# Patient Record
Sex: Female | Born: 1935 | Race: Black or African American | Hispanic: No | Smoking: Former smoker
Health system: Southern US, Community
[De-identification: ages and names within clinical notes are randomized; demographics above are authoritative.]

## PROBLEM LIST (undated history)

## (undated) DIAGNOSIS — I10 Essential (primary) hypertension: Secondary | ICD-10-CM

## (undated) DIAGNOSIS — H409 Unspecified glaucoma: Secondary | ICD-10-CM

## (undated) HISTORY — PX: EYE SURGERY: SHX253

## (undated) HISTORY — PX: TONSILLECTOMY: SUR1361

## (undated) HISTORY — PX: HERNIA REPAIR: SHX51

## (undated) HISTORY — PX: HEMORROIDECTOMY: SUR656

## (undated) HISTORY — PX: COLONOSCOPY: SHX174

## (undated) HISTORY — PX: ABDOMINAL HYSTERECTOMY: SHX81

---

## 2000-03-07 ENCOUNTER — Encounter: Admission: RE | Admit: 2000-03-07 | Discharge: 2000-06-05 | Payer: Self-pay | Admitting: Internal Medicine

## 2003-12-31 ENCOUNTER — Encounter: Admission: RE | Admit: 2003-12-31 | Discharge: 2003-12-31 | Payer: Self-pay | Admitting: Endocrinology

## 2005-01-03 ENCOUNTER — Ambulatory Visit (HOSPITAL_COMMUNITY): Admission: RE | Admit: 2005-01-03 | Discharge: 2005-01-03 | Payer: Self-pay | Admitting: Ophthalmology

## 2005-04-25 ENCOUNTER — Ambulatory Visit (HOSPITAL_COMMUNITY): Admission: RE | Admit: 2005-04-25 | Discharge: 2005-04-25 | Payer: Self-pay | Admitting: Ophthalmology

## 2005-06-16 ENCOUNTER — Ambulatory Visit (HOSPITAL_COMMUNITY): Admission: RE | Admit: 2005-06-16 | Discharge: 2005-06-16 | Payer: Self-pay | Admitting: Ophthalmology

## 2007-06-01 ENCOUNTER — Ambulatory Visit (HOSPITAL_COMMUNITY): Admission: RE | Admit: 2007-06-01 | Discharge: 2007-06-01 | Payer: Self-pay | Admitting: *Deleted

## 2007-06-04 ENCOUNTER — Ambulatory Visit (HOSPITAL_COMMUNITY): Admission: RE | Admit: 2007-06-04 | Discharge: 2007-06-04 | Payer: Self-pay | Admitting: *Deleted

## 2008-09-12 HISTORY — PX: OTHER SURGICAL HISTORY: SHX169

## 2010-03-06 ENCOUNTER — Ambulatory Visit (HOSPITAL_COMMUNITY): Admission: RE | Admit: 2010-03-06 | Discharge: 2010-03-06 | Payer: Self-pay | Admitting: Ophthalmology

## 2010-11-22 ENCOUNTER — Other Ambulatory Visit: Payer: Self-pay | Admitting: Endocrinology

## 2010-11-22 DIAGNOSIS — R634 Abnormal weight loss: Secondary | ICD-10-CM

## 2010-11-24 ENCOUNTER — Ambulatory Visit
Admission: RE | Admit: 2010-11-24 | Discharge: 2010-11-24 | Disposition: A | Discharge status: Home / Self Care | Payer: Medicare Other | Source: Ambulatory Visit | Attending: Endocrinology | Admitting: Endocrinology

## 2010-11-24 DIAGNOSIS — R634 Abnormal weight loss: Secondary | ICD-10-CM

## 2010-11-24 MED ORDER — IOHEXOL 300 MG/ML  SOLN
100.0000 mL | Freq: Once | INTRAMUSCULAR | Status: AC | PRN
  Administered 2010-11-24: 100 mL via INTRAVENOUS

## 2010-11-28 LAB — CBC
HCT: 38.5 % (ref 36.0–46.0)
Hemoglobin: 13 g/dL (ref 12.0–15.0)
MCH: 31.4 pg (ref 26.0–34.0)
MCHC: 33.8 g/dL (ref 30.0–36.0)
MCV: 92.9 fL (ref 78.0–100.0)
Platelets: 249 10*3/uL (ref 150–400)
RBC: 4.14 MIL/uL (ref 3.87–5.11)
RDW: 13.8 % (ref 11.5–15.5)
WBC: 7.3 10*3/uL (ref 4.0–10.5)

## 2010-11-28 LAB — GLUCOSE, CAPILLARY
Glucose-Capillary: 86 mg/dL (ref 70–99)
Glucose-Capillary: 90 mg/dL (ref 70–99)

## 2010-11-28 LAB — BASIC METABOLIC PANEL
CO2: 29 mEq/L (ref 19–32)
GFR calc Af Amer: >60 mL/min (ref >60)
GFR calc non Af Amer: >60 mL/min (ref >60)
Glucose, Bld: 91 mg/dL (ref 70–99)
Sodium: 138 mEq/L (ref 135–145)

## 2010-11-28 LAB — MRSA PCR SCREENING: MRSA by PCR: NEGATIVE

## 2010-12-01 NOTE — Op Note (Signed)
Stacey Nelson, Stacey Nelson                ACCOUNT NO.:  [id number]  MEDICAL RECORD NO.:  [id number]          PATIENT TYPE:  AMB  LOCATION:  SDS                          FACILITY:  [facility]  PHYSICIAN:  Chalmers Guest, M.D.     DATE OF BIRTH:  [birth year]  DATE OF PROCEDURE:  03/06/2010 DATE OF DISCHARGE:  03/06/2010                              OPERATIVE REPORT  PREOPERATIVE DIAGNOSIS:  Uncontrolled open-angle glaucoma, left eye, despite maximum tolerated medical therapy and maximum laser therapy.  POSTOPERATIVE DIAGNOSES:  Uncontrolled open-angle glaucoma, left eye, despite maximum tolerated medical therapy and maximum laser therapy. The patient has preexisting scarring to the eye from previous cataract surgery.  ANESTHESIA:  Topical Xylocaine jelly as well as topical Xylocaine, Marcaine mixture.  In the holding area, the topical anesthetics were applied and the patient was transported to the operating room where her face was prepped and draped in the usual sterile fashion with the surgeon sitting superiorly and operating microscope in position.  A 6-0 nylon suture was passed through clear cornea to infraduct the eye.  Following this, the superior temporal quadrant was chosen because the superior nasal quadrant had conjunctival scarring.  Using Hoskins forceps and a Westcott scissor, an incision was made at the limbus and was bluntly dissected posteriorly forming a fornix-based conjunctival flap.  The conjunctiva was recessed using a Weck-cel sponge.  A Tooke blade was used to recess Tenon fibers.  There was slightly excessive bleeding that was controlled with cautery.  Following this, a 45-degree blade was used to fashion a half-thickness scleral flap with the base of 4 mm at the limbus and a Maumenee-Colibri forceps was used to elevate the flap and a Grieshaber 5700 blade was used to dissect the limbal based scleral flap. Bleeding again was controlled with cautery.  Mitomycin-C 0.4 mg/mL  was placed on a Gelfoam sponge and allowed to stay under the conjunctiva for 2-1/2 minutes and then irrigated it with 60 mL of balanced salt solution.  Following this, a paracentesis was performed through the temporal inferior clear cornea at the 4 o'clock position and Provisc was injected in the eye.  The scleral flap was elevated and the a 27-gauge needle was passed under the scleral flap and into the anterior chamber. Then, the Express mini shunt which was a preloaded on EDS [id number] number [id number] was noted to have no defects.  It was rotated and passed into the eye by holding the sclera with 0.12 and then pushed into the eye and rotated into position and released easily in the eye.  Following this, the scleral flap was sutured with two interrupted 10-0 nylon sutures. The conjunctiva was then closed with a 9-0 Vicryl on a BV100 needle in each corner and secured centrally.  BSS was injected in the anterior chamber to allow the viscoelastic to egress through the paracentesis tract.  There was no leakage at the incision that was Seidel negative. A subconjunctival injection of Kenalog 4 mg was given and topical TobraDex was applied to the eye.  Complications were none.  Patch and Fox shield were placed and the patient returned to recovery area  in stable condition. the end of the     Chalmers Guest, M.D.   RW/MEDQ  D:  03/06/2010  T:  03/07/2010  Job:  [id number]  Electronically Signed by Chalmers Guest M.D. on 12/01/2010 04:30:44 PM

## 2011-01-25 NOTE — Op Note (Signed)
NAMEMERRANDA, BOLLS NO.:  [id number]   MEDICAL RECORD NO.:  [id number]          PATIENT TYPE:  AMB   LOCATION:  ENDO                         FACILITY:  [facility]   PHYSICIAN:  Georgiana Spinner, M.D.    DATE OF BIRTH:  12/17/1925   DATE OF PROCEDURE:  06/01/2007  DATE OF DISCHARGE:                               OPERATIVE REPORT   PROCEDURE:  Upper endoscopy.   INDICATIONS:  Hemoccult positivity.   ANESTHESIA:  Fentanyl 75 mcg, Versed 4 mg.   DESCRIPTION OF PROCEDURE:  With the patient mildly sedated in the left  lateral decubitus position, the Pentax videoscopic endoscope was  inserted into the mouth, passed under direct vision through the  esophagus into the stomach, and subsequently the duodenal bulb, and  second portion of the duodenum.  From this point, the endoscope was  slowly withdrawn taking circumferential views of the duodenal mucosa  until the endoscope was then pulled back, and the stomach placed in  retroflexion to view the stomach from below.  The endoscope was  straightened and withdrawn, taking circumferential views of the  remaining gastric and esophageal mucosa.  The patient's vital signs and  pulse oximeter remained stable.  The patient tolerated the procedure  well without apparent complications.   FINDINGS:  AVM of duodenum, otherwise an unremarkable exam except for  loose wrap of the GE junction around the endoscope.   PLAN:  Proceed to colonoscopy and schedule the patient for capsule  endoscopy to evaluate heme-positive stools further.           ______________________________  Georgiana Spinner, M.D.     GMO/MEDQ  D:  06/01/2007  T:  06/01/2007  Job:  [id number]

## 2011-01-25 NOTE — Op Note (Signed)
NAMEILLA, ENLOW NO.:  [id number]   MEDICAL RECORD NO.:  [id number]          PATIENT TYPE:  AMB   LOCATION:  ENDO                         FACILITY:  [facility]   PHYSICIAN:  Georgiana Spinner, M.D.    DATE OF BIRTH:  06-06-1926   DATE OF PROCEDURE:  05/31/2007  DATE OF DISCHARGE:                               OPERATIVE REPORT   PROCEDURE:  Colonoscopy.   INDICATIONS:  Hemoccult positivity.   ANESTHESIA:  Versed 1 mg.   PROCEDURE:  With the patient mildly sedated in the left lateral  decubitus position, the Pentax videoscopic colonoscope was inserted in  the rectum and passed through a very tight, tortuous sigmoid colon that  was diverticula-filled.  We were able to find and follow the lumen.  Subsequently advanced to the cecum, identified by ileocecal valve and  appendiceal orifice, both of which were photographed.  From this point  colonoscope was slowly withdrawn taking circumferential views of the  colonic mucosa, stopping to photograph diverticula seen along the way,  somewhat in the right colon but much more so in the left colon, where a  thickening of the sigmoid colon was noted.  The endoscope was withdrawn  all the way to the rectum, which appeared normal on direct and  retroflexed view.  The endoscope was straightened and withdrawn.  The  patient's vital signs and pulse oximeter remained stable.  The patient  tolerated the procedure well without apparent complications.   FINDINGS:  Diffuse diverticulosis of the colon, mostly in the sigmoid,  otherwise an unremarkable exam.   PLAN:  See endoscopy note for further details of follow-up.           ______________________________  Georgiana Spinner, M.D.     GMO/MEDQ  D:  06/01/2007  T:  06/01/2007  Job:  [id number]

## 2011-01-28 NOTE — Op Note (Signed)
NAMEARZU, MCGAUGHEY NO.:  [id number]   MEDICAL RECORD NO.:  [id number]          PATIENT TYPE:  OIB   LOCATION:  2854                         FACILITY:  [facility]   PHYSICIAN:  Salley Scarlet., M.D.DATE OF BIRTH:  1926/08/15   DATE OF PROCEDURE:  04/25/2005  DATE OF DISCHARGE:  04/25/2005                                 OPERATIVE REPORT   PREOPERATIVE DIAGNOSIS:  Immature cataract, left eye.   POSTOPERATIVE DIAGNOSIS:  Immature cataract, left eye.   OPERATION PERFORMED:  Kelman phacoemulsification, cataract, left eye.   SURGEON:  Nadyne Coombes, M.D.   ANESTHESIA:  Local using Xylocaine 2% and Marcaine 0.75% and Wydase.  /INDICATIONS FOR PROCEDURE/>  The patient is a 77 year old lady who underwent a cataract extraction of the  right eye several months ago.  She still complains of blurring of vision  with difficulty seeing to read.  She was evaluated and found to have  immature cataract of the left eye with a visual acuity best corrected to  20/60.  Cataract extraction with intraocular lens implantation was  recommended.  She was admitted at this time for that purpose.   DESCRIPTION OF PROCEDURE:  Under the influence of intravenous sedation and  Van Lint akinesia, retrobulbar anesthesia was given.  The patient was  prepped and draped in the usual manner.  A lid speculum was inserted under  the upper and lower lid of the left eye and a 4-0 silk traction suture was  passed through the belly of the superior rectus muscle for traction.  A  fornix-based conjunctival flap was turned and hemostasis achieved using  cautery.  An incision was made in the sclera at the limbus with the [facility]  blade.  This incision was dissected down to clear cornea using a crescent  blade.  A side port incision was made at the 1:30 position and OcuCoat was  injected into the eye through the side port incision. The anterior chamber  was then entered through the corneoscleral tunnel  incision at the 11:30  o'clock position with the [facility] blade.  An anterior capsulotomy was done  using a bent 25 gauge needle.  The nucleus was hydrodissected using  Xylocaine.  The KPE handpiece was passed into the eye and the nucleus was  emulsified without difficulty.  The residual cortical material was  aspirated.  The posterior capsule was polished with the olive tip polisher.  The wound was widened slightly to accommodate a foldable silicon lens.  This  lens was seated into the eye behind the iris without difficulty.  The  anterior chamber was reformed and the pupil was constricted using Miochol.  Residual OcuCoat was aspirated from the eye.  The lips of the wound were  hydrated and tested to make sure there was no leak.  After ascertaining that  there was no leak, the conjunctiva was closed over the wound using thermal  cautery.  1 mL of Celestone and 0.5 mL of gentamicin were injected  subconjunctivally.  Maxitrol ophthalmic ointment and Pilopine ointment were  applied along with a patch and Fox shield. The patient  tolerated the  procedure well and was discharged to the post anesthesia recovery room in  satisfactory condition.  The  patient was instructed to rest today, to take Vicodin every four hours as  needed for pain and to see me in the office tomorrow for further evaluation.   DISCHARGE DIAGNOSIS:  Immature cataract, left eye.      Salley Scarlet., M.D.  Electronically Signed     TB/MEDQ  D:  04/25/2005  T:  04/26/2005  Job:  [id number]

## 2011-01-28 NOTE — Op Note (Signed)
Stacey Nelson, Stacey Nelson NO.:  [id number]   MEDICAL RECORD NO.:  [id number]          PATIENT TYPE:  OIB   LOCATION:  2866                         FACILITY:  [facility]   PHYSICIAN:  Salley Scarlet., M.D.DATE OF BIRTH:  1925/12/15   DATE OF PROCEDURE:  01/03/2005  DATE OF DISCHARGE:  01/03/2005                                 OPERATIVE REPORT   PREOPERATIVE DIAGNOSIS:  Immature cataract, right eye.   POSTOPERATIVE DIAGNOSIS:  Immature cataract, right eye.   OPERATION PERFORMED:  Kelman phacoemulsification of cataract, right eye with  intraocular lens implantation.   SURGEON:  Nadyne Coombes, M.D.   ANESTHESIA:  Local using Xylocaine 2% and Marcaine 0.75% and Wydase.   JUSTIFICATION FOR PROCEDURE:  The patient is a 77 year old lady who  complains of blurring of vision with difficulty seeing to read drive.  She  was evaluated and found to have a visual acuity best corrected to 20/200 on  the right, 20/80 on the left.  There were bilateral 2+ nuclear sclerotic  cataracts, slightly worse on the right than on the left.  Cataract  extraction with intraocular lens implantation was recommended.  She was  admitted at this time for that purpose.   DESCRIPTION OF PROCEDURE:  Under the influence of intravenous sedation and  Van Lint akinesia, retrobulbar anesthesia was given.  The patient was  prepped and draped in the usual manner.  A lid speculum was inserted under  the upper and lower lid of the right eye and a 4-0 silk traction suture was  passed through the belly of the superior rectus muscle for traction.  A  fornix-based conjunctival flap was turned and hemostasis achieved using  cautery.  An incision was made in the sclera at the limbus using a [place]  blade and then dissected down to clear cornea using a crescent blade.  A  side port incision was made at the 1:30 o'clock position.  OcuCoat was  injected into the eye through the side port incision.  The  anterior chamber  was entered through the corneoscleral tunnel incision at the 11:30 o'clock  position.  An anterior capsulotomy was done using a bent 25 gauge needle.  The nucleus was hydrodissected using Xylocaine.  The  [facility]  phacoemulsification handpiece was passed into the eye and the nucleus was  emulsified without difficulty.  The residual cortical material was  aspirated.  The posterior capsule was polished with the olive tip polisher.  The wound was widened slightly to accommodate a foldable silicon lens.  This  lens was seated into the eye behind the iris without difficulty.  The  anterior chamber was reformed and the pupil was constricted using Miochol.  The lips of the wound were hydrated and tested to make sure that there was  no leak.  After it was ascertained that there was no leak, the conjunctiva  was closed over the wound using thermal cautery.  1mL of Celestone and 0.36mL  of gentamicin were injected subconjunctivally.  Maxitrol ophthalmic ointment  and Pilopine ointment were applied along with a patch and Fox shield. The  patient tolerated the procedure well and was  discharged to the post anesthesia recovery room in satisfactory condition.  The patient was instructed to rest today, to take Vicodin every four hours  as needed for pain and to see me in the office tomorrow for further  evaluation.   DISCHARGE DIAGNOSIS:  Immature cataract, right eye.      TB/MEDQ  D:  01/03/2005  T:  01/04/2005  Job:  [id number]

## 2012-06-02 ENCOUNTER — Emergency Department (HOSPITAL_COMMUNITY): Payer: Medicare Other

## 2012-06-02 ENCOUNTER — Encounter (HOSPITAL_COMMUNITY): Payer: Self-pay | Admitting: Emergency Medicine

## 2012-06-02 ENCOUNTER — Emergency Department (HOSPITAL_COMMUNITY)
Admission: EM | Admit: 2012-06-02 | Discharge: 2012-06-03 | Disposition: A | Discharge status: Home / Self Care | Payer: Medicare Other | Attending: Emergency Medicine | Admitting: Emergency Medicine

## 2012-06-02 DIAGNOSIS — Z880 Allergy status to penicillin: Secondary | ICD-10-CM | POA: Insufficient documentation

## 2012-06-02 DIAGNOSIS — Z87891 Personal history of nicotine dependence: Secondary | ICD-10-CM | POA: Insufficient documentation

## 2012-06-02 DIAGNOSIS — E119 Type 2 diabetes mellitus without complications: Secondary | ICD-10-CM | POA: Insufficient documentation

## 2012-06-02 DIAGNOSIS — S838X9A Sprain of other specified parts of unspecified knee, initial encounter: Secondary | ICD-10-CM | POA: Insufficient documentation

## 2012-06-02 DIAGNOSIS — W010XXA Fall on same level from slipping, tripping and stumbling without subsequent striking against object, initial encounter: Secondary | ICD-10-CM | POA: Insufficient documentation

## 2012-06-02 DIAGNOSIS — S76119A Strain of unspecified quadriceps muscle, fascia and tendon, initial encounter: Secondary | ICD-10-CM

## 2012-06-02 DIAGNOSIS — Z794 Long term (current) use of insulin: Secondary | ICD-10-CM | POA: Insufficient documentation

## 2012-06-02 DIAGNOSIS — I10 Essential (primary) hypertension: Secondary | ICD-10-CM | POA: Insufficient documentation

## 2012-06-02 DIAGNOSIS — S86819A Strain of other muscle(s) and tendon(s) at lower leg level, unspecified leg, initial encounter: Secondary | ICD-10-CM | POA: Insufficient documentation

## 2012-06-02 HISTORY — DX: Unspecified glaucoma: H40.9

## 2012-06-02 HISTORY — DX: Essential (primary) hypertension: I10

## 2012-06-02 MED ORDER — OXYCODONE-ACETAMINOPHEN 5-325 MG PO TABS
1.0000 | ORAL_TABLET | Freq: Once | ORAL | Status: AC
  Administered 2012-06-02: 1 via ORAL
  Filled 2012-06-02: qty 1

## 2012-06-02 NOTE — ED Notes (Signed)
Pt presents to ED today c/o R knee pain. Pt reports that there was water on the floor, she slipped and fell on her L side and twisted her R knee. Pt has no hx of R knee injury. Pt has visible swelling to R knee and feels warm to touch

## 2012-06-02 NOTE — ED Provider Notes (Signed)
History     CSN: [id number]  Arrival date & time 06/02/12  1807   First MD Initiated Contact with Patient 06/02/12 2148      Chief Complaint  Patient presents with  . Knee Pain    (Consider location/radiation/quality/duration/timing/severity/associated sxs/prior treatment) HPI Comments: Stacey Nelson is a 78 y.o. Female who slipped and fell at home, injuring her right leg. Since that time, she has not been able to walk secondary to right knee pain. She denies other injuries. She states that the fall was secondary to water on the floor. No prior injuries to the right knee. She has increased pain with walking attempts and movement of the right leg. There are no palliative factors.  Patient is a 78 y.o. female presenting with knee pain. The history is provided by the patient.  Knee Pain    Past Medical History  Diagnosis Date  . Hypertension   . Diabetes mellitus   . Glaucoma     Past Surgical History  Procedure Date  . Abdominal hysterectomy   . Tonsillectomy   . Hernia repair   . Colonoscopy   . Eye surgery   . Hemorroidectomy     No family history on file.  History  Substance Use Topics  . Smoking status: Former Games developer  . Smokeless tobacco: Not on file  . Alcohol Use: No    OB History    Grav Para Term Preterm Abortions TAB SAB Ect Mult Living                  Review of Systems  All other systems reviewed and are negative.    Allergies  Penicillins  Home Medications   Current Outpatient Rx  Name Route Sig Dispense Refill  . EZETIMIBE 10 MG PO TABS Oral Take 10 mg by mouth daily.    . INSULIN GLARGINE 100 UNIT/ML [place] SOLN Subcutaneous Inject 70 Units into the skin daily.    . ADULT MULTIVITAMIN W/MINERALS CH Oral Take 1 tablet by mouth daily.    . NEBIVOLOL HCL 5 MG PO TABS Oral Take 5 mg by mouth daily.    Marland Kitchen PRAVASTATIN SODIUM 80 MG PO TABS Oral Take 80 mg by mouth daily.    . QUINAPRIL-HYDROCHLOROTHIAZIDE 20-12.5 MG PO TABS Oral Take 1 tablet by  mouth daily.    Marland Kitchen SITAGLIPTIN PHOSPHATE 100 MG PO TABS Oral Take 100 mg by mouth daily.    . OXYCODONE-ACETAMINOPHEN 5-325 MG PO TABS Oral Take 1 tablet by mouth every 4 (four) hours as needed for pain. 20 tablet 0    BP 218/72  Pulse 85  Temp 98.7 F (37.1 C)  Resp 22  SpO2 96%  Physical Exam  Nursing note and vitals reviewed. Constitutional: She is oriented to person, place, and time. She appears well-developed and well-nourished.  HENT:  Head: Normocephalic and atraumatic.  Eyes: Conjunctivae normal and EOM are normal. Pupils are equal, round, and reactive to light.  Neck: Normal range of motion and phonation normal. Neck supple.  Cardiovascular: Normal rate and intact distal pulses.   Pulmonary/Chest: Effort normal. She exhibits no tenderness.  Abdominal: Soft. She exhibits no distension. There is no tenderness. There is no guarding.  Musculoskeletal: Normal range of motion.       Right knee, markedly swollen. Tender, primarily suprapatellar. No apparent patella, alta. She is unable to extend the right knee, to lift the right leg off the stretcher.  Neurological: She is alert and oriented to person, place, and  time. She has normal strength. She exhibits normal muscle tone.  Skin: Skin is warm and dry.  Psychiatric: She has a normal mood and affect. Her behavior is normal. Judgment and thought content normal.    ED Course  Procedures (including critical care time)  Labs Reviewed - No data to display Dg Knee Complete 4 Views Right  06/02/2012  *RADIOLOGY REPORT*  Clinical Data: Twisting injury.  RIGHT KNEE - COMPLETE 4+ VIEW  Comparison: None.  Findings: No fracture or dislocation.  Prominent prepatellar edema/hematoma.  Vascular calcifications.  Bone infarct distal right femoral shaft may be present.  IMPRESSION: Post traumatic prepatellar bursitis/hematoma.  No underlying fracture or dislocation.   Original Report Authenticated By: Fuller Canada, M.D.      1. Quadriceps  tendon rupture       MDM  Acute right knee injury, with most likely complete disruption of the quadriceps tendon from the patella. No apparent fracture. She is stable for discharge. Outpatient followup plans have been made with the on-call orthopedist.     Plan: Home Medications- Percocet; Home Treatments- Keep knee immobilizer on when up; Recommended follow up- Ortho asap       Flint Melter, MD 06/03/12 [id number]

## 2012-06-03 MED ORDER — OXYCODONE-ACETAMINOPHEN 5-325 MG PO TABS
ORAL_TABLET | ORAL | Status: AC
  Administered 2012-06-03: 1 via ORAL
  Filled 2012-06-03: qty 1

## 2012-06-03 MED ORDER — OXYCODONE-ACETAMINOPHEN 5-325 MG PO TABS: 1.0000 | ORAL_TABLET | Freq: Once | ORAL | Status: AC

## 2012-06-03 MED ORDER — OXYCODONE-ACETAMINOPHEN 5-325 MG PO TABS: 1.0000 | ORAL_TABLET | ORAL | Status: DC | PRN

## 2012-06-03 NOTE — ED Notes (Signed)
Overhead paged ortho tech. Awaiting for splint and immobilizer to be placed. Made dr aware

## 2012-06-20 ENCOUNTER — Encounter (HOSPITAL_COMMUNITY): Payer: Self-pay | Admitting: Pharmacy Technician

## 2012-06-20 ENCOUNTER — Encounter (HOSPITAL_COMMUNITY): Payer: Self-pay | Admitting: *Deleted

## 2012-06-20 NOTE — Progress Notes (Signed)
Please enter orders in epic for 05-29-2012 surgery. thanks

## 2012-06-20 NOTE — Progress Notes (Signed)
ekg 100-11-2011 dr Juleen China on chart Chest x ray 2 view 05-16-2012 dr Juleen China on chart

## 2012-06-21 NOTE — Progress Notes (Signed)
06-21-12 Need MD order entry in Epic.W. Kennon Portela

## 2012-06-23 ENCOUNTER — Other Ambulatory Visit: Payer: Self-pay | Admitting: Specialist

## 2012-06-28 ENCOUNTER — Encounter (HOSPITAL_COMMUNITY): Payer: Self-pay | Admitting: Anesthesiology

## 2012-06-28 ENCOUNTER — Encounter (HOSPITAL_COMMUNITY)
Admission: RE | Disposition: A | Discharge status: Skilled Nursing Facility | Payer: Self-pay | Source: Ambulatory Visit | Attending: Specialist

## 2012-06-28 ENCOUNTER — Encounter (HOSPITAL_COMMUNITY): Payer: Self-pay | Admitting: *Deleted

## 2012-06-28 ENCOUNTER — Observation Stay (HOSPITAL_COMMUNITY)
Admission: RE | Admit: 2012-06-28 | Discharge: 2012-06-29 | Disposition: A | Discharge status: Skilled Nursing Facility | Payer: Medicare Other | Source: Ambulatory Visit | Attending: Specialist | Admitting: Specialist

## 2012-06-28 ENCOUNTER — Ambulatory Visit (HOSPITAL_COMMUNITY): Payer: Medicare Other | Admitting: Anesthesiology

## 2012-06-28 DIAGNOSIS — H409 Unspecified glaucoma: Secondary | ICD-10-CM | POA: Insufficient documentation

## 2012-06-28 DIAGNOSIS — S8000XA Contusion of unspecified knee, initial encounter: Secondary | ICD-10-CM | POA: Insufficient documentation

## 2012-06-28 DIAGNOSIS — S82009A Unspecified fracture of unspecified patella, initial encounter for closed fracture: Secondary | ICD-10-CM | POA: Insufficient documentation

## 2012-06-28 DIAGNOSIS — X58XXXA Exposure to other specified factors, initial encounter: Secondary | ICD-10-CM | POA: Insufficient documentation

## 2012-06-28 DIAGNOSIS — M2340 Loose body in knee, unspecified knee: Secondary | ICD-10-CM | POA: Insufficient documentation

## 2012-06-28 DIAGNOSIS — Z79899 Other long term (current) drug therapy: Secondary | ICD-10-CM | POA: Insufficient documentation

## 2012-06-28 DIAGNOSIS — S838X9A Sprain of other specified parts of unspecified knee, initial encounter: Principal | ICD-10-CM | POA: Insufficient documentation

## 2012-06-28 DIAGNOSIS — S76119A Strain of unspecified quadriceps muscle, fascia and tendon, initial encounter: Secondary | ICD-10-CM

## 2012-06-28 DIAGNOSIS — E119 Type 2 diabetes mellitus without complications: Secondary | ICD-10-CM | POA: Insufficient documentation

## 2012-06-28 DIAGNOSIS — I10 Essential (primary) hypertension: Secondary | ICD-10-CM | POA: Insufficient documentation

## 2012-06-28 HISTORY — PX: QUADRICEPS TENDON REPAIR: SHX756

## 2012-06-28 LAB — GLUCOSE, CAPILLARY
Glucose-Capillary: 117 mg/dL — ABNORMAL HIGH (ref 70–99)
Glucose-Capillary: 109 mg/dL — ABNORMAL HIGH (ref 70–99)
Glucose-Capillary: 185 mg/dL — ABNORMAL HIGH (ref 70–99)
Glucose-Capillary: 148 mg/dL — ABNORMAL HIGH (ref 70–99)

## 2012-06-28 LAB — PROTIME-INR: INR: 1.01 (ref 0.00–1.49)

## 2012-06-28 LAB — BASIC METABOLIC PANEL
BUN: 13 mg/dL (ref 6–23)
Chloride: 103 mEq/L (ref 96–112)
GFR calc Af Amer: 89 mL/min — ABNORMAL LOW (ref >90)
GFR calc non Af Amer: 77 mL/min — ABNORMAL LOW (ref >90)
Potassium: 3.8 mEq/L (ref 3.5–5.1)
Sodium: 141 mEq/L (ref 135–145)

## 2012-06-28 LAB — SURGICAL PCR SCREEN: Staphylococcus aureus: NEGATIVE

## 2012-06-28 LAB — URINALYSIS, ROUTINE W REFLEX MICROSCOPIC
Bilirubin Urine: NEGATIVE
Glucose, UA: NEGATIVE mg/dL
Ketones, ur: NEGATIVE mg/dL
Protein, ur: NEGATIVE mg/dL

## 2012-06-28 LAB — CBC
MCHC: 32.9 g/dL (ref 30.0–36.0)
MCV: 91.4 fL (ref 78.0–100.0)
Platelets: 346 10*3/uL (ref 150–400)
RDW: 13.4 % (ref 11.5–15.5)
WBC: 6.5 10*3/uL (ref 4.0–10.5)

## 2012-06-28 LAB — URINE MICROSCOPIC-ADD ON

## 2012-06-28 SURGERY — REPAIR, TENDON, QUADRICEPS
Anesthesia: General | Site: Leg Upper | Laterality: Right | Wound class: Clean

## 2012-06-28 MED ORDER — ONDANSETRON HCL 4 MG/2ML IJ SOLN
INTRAMUSCULAR | Status: DC | PRN
  Administered 2012-06-28: 4 mg via INTRAVENOUS

## 2012-06-28 MED ORDER — MUPIROCIN 2 % EX OINT
TOPICAL_OINTMENT | Freq: Two times a day (BID) | CUTANEOUS | Status: DC
  Filled 2012-06-28: qty 22

## 2012-06-28 MED ORDER — ONDANSETRON HCL 4 MG PO TABS: 4.0000 mg | ORAL_TABLET | Freq: Four times a day (QID) | ORAL | Status: DC | PRN

## 2012-06-28 MED ORDER — GLYCOPYRROLATE 0.2 MG/ML IJ SOLN
INTRAMUSCULAR | Status: DC | PRN
  Administered 2012-06-28: 0.2 mg via INTRAVENOUS

## 2012-06-28 MED ORDER — LINAGLIPTIN 5 MG PO TABS
5.0000 mg | ORAL_TABLET | Freq: Every day | ORAL | Status: DC
  Administered 2012-06-28 – 2012-06-29 (×2): 5 mg via ORAL
  Filled 2012-06-28 (×2): qty 1

## 2012-06-28 MED ORDER — METHOCARBAMOL 100 MG/ML IJ SOLN
500.0000 mg | Freq: Four times a day (QID) | INTRAVENOUS | Status: DC | PRN
  Administered 2012-06-28: 500 mg via INTRAVENOUS
  Filled 2012-06-28: qty 5

## 2012-06-28 MED ORDER — NEBIVOLOL HCL 5 MG PO TABS
5.0000 mg | ORAL_TABLET | Freq: Every day | ORAL | Status: DC
  Administered 2012-06-28: 5 mg via ORAL
  Filled 2012-06-28 (×2): qty 1

## 2012-06-28 MED ORDER — PROMETHAZINE HCL 25 MG/ML IJ SOLN: 6.2500 mg | INTRAMUSCULAR | Status: DC | PRN

## 2012-06-28 MED ORDER — BUPIVACAINE-EPINEPHRINE 0.5% -1:200000 IJ SOLN
INTRAMUSCULAR | Status: DC | PRN
  Administered 2012-06-28: 12 mL
  Administered 2012-06-28: 18 mL

## 2012-06-28 MED ORDER — METOCLOPRAMIDE HCL 10 MG PO TABS: 5.0000 mg | ORAL_TABLET | Freq: Three times a day (TID) | ORAL | Status: DC | PRN

## 2012-06-28 MED ORDER — BUPIVACAINE-EPINEPHRINE (PF) 0.5% -1:200000 IJ SOLN
INTRAMUSCULAR | Status: AC
  Filled 2012-06-28: qty 10

## 2012-06-28 MED ORDER — CHLORHEXIDINE GLUCONATE 4 % EX LIQD
60.0000 mL | Freq: Once | CUTANEOUS | Status: DC
  Filled 2012-06-28: qty 60

## 2012-06-28 MED ORDER — FENTANYL CITRATE 0.05 MG/ML IJ SOLN
INTRAMUSCULAR | Status: DC | PRN
  Administered 2012-06-28 (×2): 50 ug via INTRAVENOUS
  Administered 2012-06-28: 100 ug via INTRAVENOUS
  Administered 2012-06-28: 50 ug via INTRAVENOUS

## 2012-06-28 MED ORDER — LACTATED RINGERS IV SOLN
INTRAVENOUS | Status: DC
  Administered 2012-06-28: 14:00:00 via INTRAVENOUS
  Administered 2012-06-28: 1000 mL via INTRAVENOUS

## 2012-06-28 MED ORDER — ACETAMINOPHEN 10 MG/ML IV SOLN
INTRAVENOUS | Status: AC
  Filled 2012-06-28: qty 100

## 2012-06-28 MED ORDER — POTASSIUM CHLORIDE 2 MEQ/ML IV SOLN
INTRAVENOUS | Status: DC
  Administered 2012-06-28: 15:00:00 via INTRAVENOUS
  Filled 2012-06-28 (×2): qty 1000

## 2012-06-28 MED ORDER — CEFAZOLIN SODIUM-DEXTROSE 2-3 GM-% IV SOLR
2.0000 g | INTRAVENOUS | Status: AC
  Administered 2012-06-28: 2 g via INTRAVENOUS

## 2012-06-28 MED ORDER — HYDROCHLOROTHIAZIDE 12.5 MG PO CAPS
12.5000 mg | ORAL_CAPSULE | Freq: Every day | ORAL | Status: DC
  Administered 2012-06-28 – 2012-06-29 (×2): 12.5 mg via ORAL
  Filled 2012-06-28 (×2): qty 1

## 2012-06-28 MED ORDER — METHOCARBAMOL 500 MG PO TABS
500.0000 mg | ORAL_TABLET | Freq: Four times a day (QID) | ORAL | Status: DC | PRN
  Administered 2012-06-28 – 2012-06-29 (×2): 500 mg via ORAL
  Filled 2012-06-28 (×2): qty 1

## 2012-06-28 MED ORDER — 0.9 % SODIUM CHLORIDE (POUR BTL) OPTIME
TOPICAL | Status: DC | PRN
  Administered 2012-06-28: 1000 mL

## 2012-06-28 MED ORDER — LIDOCAINE HCL (CARDIAC) 20 MG/ML IV SOLN
INTRAVENOUS | Status: DC | PRN
  Administered 2012-06-28: 75 mg via INTRAVENOUS

## 2012-06-28 MED ORDER — BIMATOPROST 0.03 % OP SOLN
1.0000 [drp] | Freq: Every day | OPHTHALMIC | Status: DC
  Filled 2012-06-28: qty 2.5

## 2012-06-28 MED ORDER — ADULT MULTIVITAMIN W/MINERALS CH
1.0000 | ORAL_TABLET | Freq: Every day | ORAL | Status: DC
  Administered 2012-06-28 – 2012-06-29 (×2): 1 via ORAL
  Filled 2012-06-28 (×2): qty 1

## 2012-06-28 MED ORDER — BIMATOPROST 0.03 % OP SOLN: 1.0000 [drp] | Freq: Every day | OPHTHALMIC | Status: DC

## 2012-06-28 MED ORDER — LISINOPRIL 20 MG PO TABS
20.0000 mg | ORAL_TABLET | Freq: Every day | ORAL | Status: DC
  Administered 2012-06-28 – 2012-06-29 (×2): 20 mg via ORAL
  Filled 2012-06-28 (×2): qty 1

## 2012-06-28 MED ORDER — METOCLOPRAMIDE HCL 5 MG/ML IJ SOLN: 5.0000 mg | Freq: Three times a day (TID) | INTRAMUSCULAR | Status: DC | PRN

## 2012-06-28 MED ORDER — ACETAMINOPHEN 10 MG/ML IV SOLN
INTRAVENOUS | Status: DC | PRN
  Administered 2012-06-28: 1000 mg via INTRAVENOUS

## 2012-06-28 MED ORDER — ENOXAPARIN SODIUM 40 MG/0.4ML ~~LOC~~ SOLN
40.0000 mg | SUBCUTANEOUS | Status: DC
  Administered 2012-06-29: 40 mg via SUBCUTANEOUS
  Filled 2012-06-28 (×2): qty 0.4

## 2012-06-28 MED ORDER — INSULIN ASPART 100 UNIT/ML ~~LOC~~ SOLN
0.0000 [IU] | Freq: Three times a day (TID) | SUBCUTANEOUS | Status: DC
  Administered 2012-06-28: 3 [IU] via SUBCUTANEOUS
  Administered 2012-06-29: 2 [IU] via SUBCUTANEOUS

## 2012-06-28 MED ORDER — INSULIN GLARGINE 100 UNIT/ML ~~LOC~~ SOLN
70.0000 [IU] | Freq: Every morning | SUBCUTANEOUS | Status: DC
  Administered 2012-06-28 – 2012-06-29 (×2): 70 [IU] via SUBCUTANEOUS

## 2012-06-28 MED ORDER — LACTATED RINGERS IV SOLN: INTRAVENOUS | Status: DC

## 2012-06-28 MED ORDER — EZETIMIBE 10 MG PO TABS
10.0000 mg | ORAL_TABLET | Freq: Every day | ORAL | Status: DC
  Administered 2012-06-28: 10 mg via ORAL
  Filled 2012-06-28 (×2): qty 1

## 2012-06-28 MED ORDER — CEFAZOLIN SODIUM-DEXTROSE 2-3 GM-% IV SOLR
2.0000 g | Freq: Four times a day (QID) | INTRAVENOUS | Status: AC
  Administered 2012-06-28 – 2012-06-29 (×3): 2 g via INTRAVENOUS
  Filled 2012-06-28 (×3): qty 50

## 2012-06-28 MED ORDER — ONDANSETRON HCL 4 MG/2ML IJ SOLN: 4.0000 mg | Freq: Four times a day (QID) | INTRAMUSCULAR | Status: DC | PRN

## 2012-06-28 MED ORDER — QUINAPRIL-HYDROCHLOROTHIAZIDE 20-12.5 MG PO TABS
1.0000 | ORAL_TABLET | Freq: Every morning | ORAL | Status: DC
Start: 2012-06-28 — End: 2012-06-28

## 2012-06-28 MED ORDER — MEPERIDINE HCL 50 MG/ML IJ SOLN: 6.2500 mg | INTRAMUSCULAR | Status: DC | PRN

## 2012-06-28 MED ORDER — HYDROMORPHONE HCL PF 1 MG/ML IJ SOLN
0.5000 mg | INTRAMUSCULAR | Status: DC | PRN
  Administered 2012-06-28: 1 mg via INTRAVENOUS
  Filled 2012-06-28: qty 1

## 2012-06-28 MED ORDER — CEFAZOLIN SODIUM-DEXTROSE 2-3 GM-% IV SOLR
INTRAVENOUS | Status: AC
  Filled 2012-06-28: qty 50

## 2012-06-28 MED ORDER — ACETAMINOPHEN 500 MG PO TABS: 500.0000 mg | ORAL_TABLET | Freq: Four times a day (QID) | ORAL | Status: DC | PRN

## 2012-06-28 MED ORDER — OXYCODONE-ACETAMINOPHEN 5-325 MG PO TABS
1.0000 | ORAL_TABLET | ORAL | Status: DC | PRN
  Administered 2012-06-28 – 2012-06-29 (×4): 1 via ORAL
  Filled 2012-06-28 (×4): qty 1

## 2012-06-28 MED ORDER — PROPOFOL 10 MG/ML IV BOLUS
INTRAVENOUS | Status: DC | PRN
  Administered 2012-06-28: 50 mg via INTRAVENOUS
  Administered 2012-06-28: 150 mg via INTRAVENOUS

## 2012-06-28 MED ORDER — BRINZOLAMIDE 1 % OP SUSP
1.0000 [drp] | Freq: Three times a day (TID) | OPHTHALMIC | Status: DC
  Administered 2012-06-28 – 2012-06-29 (×3): 1 [drp] via OPHTHALMIC
  Filled 2012-06-28: qty 10

## 2012-06-28 MED ORDER — EPHEDRINE SULFATE 50 MG/ML IJ SOLN
INTRAMUSCULAR | Status: DC | PRN
  Administered 2012-06-28 (×2): 5 mg via INTRAVENOUS

## 2012-06-28 MED ORDER — FENTANYL CITRATE 0.05 MG/ML IJ SOLN: 25.0000 ug | INTRAMUSCULAR | Status: DC | PRN

## 2012-06-28 SURGICAL SUPPLY — 61 items
BAG ZIPLOCK 12X15 (MISCELLANEOUS) ×2 IMPLANT
BANDAGE GAUZE ELAST BULKY 4 IN (GAUZE/BANDAGES/DRESSINGS) IMPLANT
BIT DRILL 2.8X128 (BIT) IMPLANT
BNDG ELASTIC 6X10 VLCR STRL LF (GAUZE/BANDAGES/DRESSINGS) IMPLANT
CHLORAPREP W/TINT 26ML (MISCELLANEOUS) IMPLANT
CLOTH BEACON ORANGE TIMEOUT ST (SAFETY) ×2 IMPLANT
CORDS BIPOLAR (ELECTRODE) IMPLANT
DRAPE EXTREMITY T 121X128X90 (DRAPE) ×2 IMPLANT
DRAPE LG THREE QUARTER DISP (DRAPES) IMPLANT
DRAPE ORTHO SPLIT 77X108 STRL (DRAPES)
DRAPE SURG ORHT 6 SPLT 77X108 (DRAPES) IMPLANT
DRAPE U-SHAPE 47X51 STRL (DRAPES) ×2 IMPLANT
DRSG ADAPTIC 3X8 NADH LF (GAUZE/BANDAGES/DRESSINGS) IMPLANT
DRSG MEPILEX BORDER 4X8 (GAUZE/BANDAGES/DRESSINGS) ×2 IMPLANT
DRSG PAD ABDOMINAL 8X10 ST (GAUZE/BANDAGES/DRESSINGS) IMPLANT
DURAPREP 26ML APPLICATOR (WOUND CARE) ×2 IMPLANT
ELECT REM PT RETURN 9FT ADLT (ELECTROSURGICAL) ×2
ELECTRODE REM PT RTRN 9FT ADLT (ELECTROSURGICAL) ×1 IMPLANT
GLOVE BIO SURGEON STRL SZ7 (GLOVE) ×2 IMPLANT
GLOVE BIOGEL PI IND STRL 6.5 (GLOVE) ×3 IMPLANT
GLOVE BIOGEL PI IND STRL 7.5 (GLOVE) ×1 IMPLANT
GLOVE BIOGEL PI IND STRL 8 (GLOVE) IMPLANT
GLOVE BIOGEL PI INDICATOR 6.5 (GLOVE) ×3
GLOVE BIOGEL PI INDICATOR 7.5 (GLOVE) ×1
GLOVE BIOGEL PI INDICATOR 8 (GLOVE)
GLOVE ECLIPSE 6.5 STRL STRAW (GLOVE) ×4 IMPLANT
GLOVE INDICATOR 6.5 STRL GRN (GLOVE) IMPLANT
GLOVE SURG SS PI 6.5 STRL IVOR (GLOVE) ×2 IMPLANT
GLOVE SURG SS PI 8.0 STRL IVOR (GLOVE) ×4 IMPLANT
GOWN PREVENTION PLUS LG XLONG (DISPOSABLE) ×4 IMPLANT
GOWN STRL REIN XL XLG (GOWN DISPOSABLE) ×4 IMPLANT
IMMOBILIZER KNEE 20 (SOFTGOODS) ×2
IMMOBILIZER KNEE 20 THIGH 36 (SOFTGOODS) ×1 IMPLANT
KIT BASIN OR (CUSTOM PROCEDURE TRAY) ×2 IMPLANT
MANIFOLD NEPTUNE II (INSTRUMENTS) ×2 IMPLANT
NEEDLE HYPO 22GX1.5 SAFETY (NEEDLE) ×2 IMPLANT
NEEDLE MA TROC 1/2 CIR (NEEDLE) IMPLANT
NEEDLE MAYO .5 CIRCLE (NEEDLE) IMPLANT
NEEDLE SPNL 18GX3.5 QUINCKE PK (NEEDLE) IMPLANT
NS IRRIG 1000ML POUR BTL (IV SOLUTION) ×2 IMPLANT
PACK TOTAL JOINT (CUSTOM PROCEDURE TRAY) ×2 IMPLANT
PASSER SUT SWANSON 36MM LOOP (INSTRUMENTS) ×2 IMPLANT
POSITIONER SURGICAL ARM (MISCELLANEOUS) ×2 IMPLANT
SPONGE GAUZE 4X4 12PLY (GAUZE/BANDAGES/DRESSINGS) ×2 IMPLANT
SPONGE LAP 18X18 X RAY DECT (DISPOSABLE) ×2 IMPLANT
SPONGE LAP 4X18 X RAY DECT (DISPOSABLE) ×2 IMPLANT
STAPLER VISISTAT (STAPLE) ×2 IMPLANT
STOCKINETTE 8 INCH (MISCELLANEOUS) ×2 IMPLANT
SUT ETHIBOND 5 LR DA (SUTURE) ×2 IMPLANT
SUT ETHIBOND NAB CT1 #1 30IN (SUTURE) ×2 IMPLANT
SUT FIBERWIRE #2 38 T-5 BLUE (SUTURE) ×4
SUT VIC AB 0 CT1 27 (SUTURE) ×1
SUT VIC AB 0 CT1 27XBRD ANTBC (SUTURE) ×1 IMPLANT
SUT VIC AB 1 CT1 36 (SUTURE) ×4 IMPLANT
SUT VIC AB 2-0 CT1 27 (SUTURE) ×3
SUT VIC AB 2-0 CT1 TAPERPNT 27 (SUTURE) ×3 IMPLANT
SUTURE FIBERWR #2 38 T-5 BLUE (SUTURE) ×2 IMPLANT
SYR 30ML LL (SYRINGE) IMPLANT
SYR CONTROL 10ML LL (SYRINGE) ×2 IMPLANT
TOWEL OR NON WOVEN STRL DISP B (DISPOSABLE) ×2 IMPLANT
WATER STERILE IRR 1500ML POUR (IV SOLUTION) IMPLANT

## 2012-06-28 NOTE — Anesthesia Preprocedure Evaluation (Addendum)
Anesthesia Evaluation  Patient identified by MRN, date of birth, ID band Patient awake    Reviewed: Allergy & Precautions, H&P , NPO status , Patient's Chart, lab work & pertinent test results  Airway Mallampati: II TM Distance: >3 FB Neck ROM: Full    Dental No notable dental hx. (+) Upper Dentures and Lower Dentures   Pulmonary neg pulmonary ROS, former smoker,  breath sounds clear to auscultation  Pulmonary exam normal       Cardiovascular hypertension, Pt. on medications negative cardio ROS  Rhythm:Regular Rate:Normal     Neuro/Psych negative neurological ROS  negative psych ROS   GI/Hepatic negative GI ROS, Neg liver ROS,   Endo/Other  negative endocrine ROSdiabetes, Oral Hypoglycemic Agents and Insulin Dependent  Renal/GU negative Renal ROS  negative genitourinary   Musculoskeletal negative musculoskeletal ROS (+)   Abdominal   Peds negative pediatric ROS (+)  Hematology negative hematology ROS (+)   Anesthesia Other Findings   Reproductive/Obstetrics negative OB ROS                           Anesthesia Physical Anesthesia Plan  ASA: III  Anesthesia Plan: General   Post-op Pain Management:    Induction: Intravenous  Airway Management Planned: LMA  Additional Equipment:   Intra-op Plan:   Post-operative Plan: Extubation in OR  Informed Consent: I have reviewed the patients History and Physical, chart, labs and discussed the procedure including the risks, benefits and alternatives for the proposed anesthesia with the patient or authorized representative who has indicated his/her understanding and acceptance.   Dental advisory given  Plan Discussed with: CRNA  Anesthesia Plan Comments:        Anesthesia Quick Evaluation

## 2012-06-28 NOTE — Transfer of Care (Signed)
Immediate Anesthesia Transfer of Care Note  Patient: Stacey Nelson  Procedure(s) Performed: Procedure(s) (LRB) with comments: REPAIR QUADRICEP TENDON (Right) - Right Repair Quadriceps Tendon  Patient Location: PACU  Anesthesia Type: General  Level of Consciousness: awake and sedated  Airway & Oxygen Therapy: Patient Spontanous Breathing and Patient connected to face mask oxygen  Post-op Assessment: Report given to PACU RN and Post -op Vital signs reviewed and stable  Post vital signs: Reviewed and stable  Complications: No apparent anesthesia complications

## 2012-06-28 NOTE — Preoperative (Signed)
Beta Blockers   Reason not to administer Beta Blockers:Took Bystolic last pm (06/27/12) sometime after 1800.

## 2012-06-28 NOTE — Anesthesia Postprocedure Evaluation (Signed)
  Anesthesia Post-op Note  Patient: Stacey Nelson  Procedure(s) Performed: Procedure(s) (LRB): REPAIR QUADRICEP TENDON (Right)  Patient Location: PACU  Anesthesia Type: General  Level of Consciousness: awake and alert   Airway and Oxygen Therapy: Patient Spontanous Breathing  Post-op Pain: mild  Post-op Assessment: Post-op Vital signs reviewed, Patient's Cardiovascular Status Stable, Respiratory Function Stable, Patent Airway and No signs of Nausea or vomiting  Post-op Vital Signs: stable  Complications: No apparent anesthesia complications

## 2012-06-28 NOTE — Interval H&P Note (Signed)
History and Physical Interval Note:  06/28/2012 10:44 AM  Stacey Nelson  has presented today for surgery, with the diagnosis of Quadriceps tendon tear  The various methods of treatment have been discussed with the patient and family. After consideration of risks, benefits and other options for treatment, the patient has consented to  Procedure(s) (LRB) with comments: REPAIR QUADRICEP TENDON (Right) - Right Repair Quadriceps Tendon as a surgical intervention .  The patient's history has been reviewed, patient examined, no change in status, stable for surgery.  I have reviewed the patient's chart and labs.  Questions were answered to the patient's satisfaction.     Ethelbert Thain C

## 2012-06-28 NOTE — Brief Op Note (Signed)
06/28/2012  11:42 AM  PATIENT:  Andrey Farmer  78 y.o. female  PRE-OPERATIVE DIAGNOSIS:  Quadriceps tendon tear  POST-OPERATIVE DIAGNOSIS:  Quadriceps tendon tear Loose bodies knee joint  PROCEDURE:  Procedure(s) (LRB) with comments: REPAIR QUADRICEP TENDON (Right) - Right Repair Quadriceps Tendon removal loose bodies. Lavage  SURGEON:  Surgeon(s) and Role:    * Javier Docker, MD - Primary  PHYSICIAN ASSISTANT:   ASSISTANTS: Skip Mayer   ANESTHESIA:   general  EBL:     BLOOD ADMINISTERED:none  DRAINS: none   LOCAL MEDICATIONS USED:  MARCAINE     SPECIMEN:  No Specimen  DISPOSITION OF SPECIMEN:  N/A  COUNTS:  YES  TOURNIQUET:  * No tourniquets in log *  DICTATION: .Other Dictation: Dictation Number [id number]  PLAN OF CARE: Admit for overnight observation  PATIENT DISPOSITION:  PACU - hemodynamically stable.   Delay start of Pharmacological VTE agent (>24hrs) due to surgical blood loss or risk of bleeding: no

## 2012-06-28 NOTE — H&P (Signed)
Stacey Nelson is an 78 y.o. female.   Chief Complaint:right knee pain and weakness HPI:[facility] with pain and bruising. Seen and evaluated with findings of patellar tendon rupture.  Past Medical History  Diagnosis Date  . Hypertension   . Diabetes mellitus   . Glaucoma     Past Surgical History  Procedure Date  . Colonoscopy   . Hemorroidectomy   . Tonsillectomy yrs ago  . Eye surgery 7 yrs ago    cataract surgery both eyes   . Right eye surgery for glaucoma 2010  . Hernia repair yrs ago    right side  . Abdominal hysterectomy yrs ago    History reviewed. No pertinent family history. Social History:  reports that she quit smoking about 43 years ago. Her smoking use included Cigarettes. She has a 20 pack-year smoking history. She has never used smokeless tobacco. She reports that she does not drink alcohol or use illicit drugs.  Allergies:  Allergies  Allergen Reactions  . Penicillins Rash    Medications Prior to Admission  Medication Sig Dispense Refill  . acetaminophen (TYLENOL) 500 MG tablet Take 500 mg by mouth every 6 (six) hours as needed. For pain      . bimatoprost (LUMIGAN) 0.03 % ophthalmic solution Place 1 drop into the right eye at bedtime.      . brinzolamide (AZOPT) 1 % ophthalmic suspension Place 1 drop into the right eye 3 (three) times daily.      Marland Kitchen ezetimibe (ZETIA) 10 MG tablet Take 10 mg by mouth at bedtime.       . insulin glargine (LANTUS) 100 UNIT/ML injection Inject 70 Units into the skin every morning.       . nebivolol (BYSTOLIC) 5 MG tablet Take 5 mg by mouth at bedtime.       Marland Kitchen oxyCODONE-acetaminophen (PERCOCET/ROXICET) 5-325 MG per tablet Take 1 tablet by mouth every 4 (four) hours as needed for pain.  20 tablet  0  . pravastatin (PRAVACHOL) 80 MG tablet Take 80 mg by mouth daily.      . quinapril-hydrochlorothiazide (ACCURETIC) 20-12.5 MG per tablet Take 1 tablet by mouth every morning.       . sitaGLIPtin (JANUVIA)  100 MG tablet Take 100 mg by mouth every morning.       . Multiple Vitamin (MULTIVITAMIN WITH MINERALS) TABS Take 1 tablet by mouth daily.        Results for orders placed during the hospital encounter of 06/28/12 (from the past 48 hour(s))  URINALYSIS, ROUTINE W REFLEX MICROSCOPIC     Status: Abnormal   Collection Time   06/28/12  7:24 AM      Component Value Range Comment   Color, Urine YELLOW  YELLOW    APPearance CLOUDY (*) CLEAR    Specific Gravity, Urine 1.026  1.005 - 1.030    pH 5.5  5.0 - 8.0    Glucose, UA NEGATIVE  NEGATIVE mg/dL    Hgb urine dipstick NEGATIVE  NEGATIVE    Bilirubin Urine NEGATIVE  NEGATIVE    Ketones, ur NEGATIVE  NEGATIVE mg/dL    Protein, ur NEGATIVE  NEGATIVE mg/dL    Urobilinogen, UA 1.0  0.0 - 1.0 mg/dL    Nitrite NEGATIVE  NEGATIVE    Leukocytes, UA SMALL (*) NEGATIVE   URINE MICROSCOPIC-ADD ON     Status: Abnormal   Collection Time   06/28/12  7:24 AM      Component  Value Range Comment   Squamous Epithelial / LPF FEW (*) RARE    WBC, UA 3-6  <3 WBC/hpf    Bacteria, UA MANY (*) RARE    Urine-Other MUCOUS PRESENT     CBC     Status: Abnormal   Collection Time   06/28/12  8:05 AM      Component Value Range Comment   WBC 6.5  4.0 - 10.5 K/uL    RBC 3.49 (*) 3.87 - 5.11 MIL/uL    Hemoglobin 10.5 (*) 12.0 - 15.0 g/dL    HCT 96.0 (*) 45.4 - 46.0 %    MCV 91.4  78.0 - 100.0 fL    MCH 30.1  26.0 - 34.0 pg    MCHC 32.9  30.0 - 36.0 g/dL    RDW 09.8  11.9 - 14.7 %    Platelets 346  150 - 400 K/uL   PROTIME-INR     Status: Normal   Collection Time   06/28/12  8:05 AM      Component Value Range Comment   Prothrombin Time 13.2  11.6 - 15.2 seconds    INR 1.01  0.00 - 1.49   GLUCOSE, CAPILLARY     Status: Abnormal   Collection Time   06/28/12  8:29 AM      Component Value Range Comment   Glucose-Capillary 117 (*) 70 - 99 mg/dL   BASIC METABOLIC PANEL     Status: Abnormal   Collection Time   06/28/12  9:13 AM      Component Value Range  Comment   Sodium 141  135 - 145 mEq/L    Potassium 3.8  3.5 - 5.1 mEq/L    Chloride 103  96 - 112 mEq/L    CO2 27  19 - 32 mEq/L    Glucose, Bld 114 (*) 70 - 99 mg/dL    BUN 13  6 - 23 mg/dL    Creatinine, Ser 8.29  0.50 - 1.10 mg/dL    Calcium 9.7  8.4 - 56.2 mg/dL    GFR calc non Af Amer 77 (*) >90 mL/min    GFR calc Af Amer 89 (*) >90 mL/min    No results found.  Review of Systems  Constitutional: Negative.   HENT: Negative.   Eyes: Positive for photophobia.  Respiratory: Positive for shortness of breath.   Cardiovascular: Positive for leg swelling.  Gastrointestinal: Positive for heartburn.  Genitourinary: Negative.   Musculoskeletal: Positive for joint pain.  Skin: Negative.   Neurological: Negative.   Endo/Heme/Allergies: Negative.   Psychiatric/Behavioral: Negative.     Blood pressure 186/75, pulse 66, temperature 98.6 F (37 C), resp. rate 20, height 5' 3.5" (1.613 m), weight 82.555 kg (182 lb), SpO2 100.00%. Physical Exam  Constitutional: She is oriented to person, place, and time. She appears well-developed and well-nourished.  HENT:  Head: Normocephalic and atraumatic.  Eyes: Pupils are equal, round, and reactive to light.  Neck: Normal range of motion.  Cardiovascular: Normal rate and regular rhythm.   Respiratory: Effort normal and breath sounds normal.  GI: Soft.  Musculoskeletal:       Right knee: She exhibits swelling, deformity and abnormal patellar mobility. tenderness found.       Obvious defect quad tendon with inablity to perform straight leg raise   Neurological: She is alert and oriented to person, place, and time.  Skin: Skin is warm and dry.  Psychiatric: She has a normal mood and affect.     Assessment/Plan  Right knee quadriceps tendon rupture/admit for open repair after discussing risks vs benefits with patient  Nelson,[name] 06/28/2012, 10:09 AM

## 2012-06-29 ENCOUNTER — Encounter (HOSPITAL_COMMUNITY): Payer: Self-pay | Admitting: Specialist

## 2012-06-29 LAB — BASIC METABOLIC PANEL
CO2: 27 mEq/L (ref 19–32)
Chloride: 98 mEq/L (ref 96–112)
Creatinine, Ser: 0.73 mg/dL (ref 0.50–1.10)
GFR calc Af Amer: 89 mL/min — ABNORMAL LOW (ref >90)
Potassium: 3.8 mEq/L (ref 3.5–5.1)
Sodium: 135 mEq/L (ref 135–145)

## 2012-06-29 LAB — GLUCOSE, CAPILLARY
Glucose-Capillary: 122 mg/dL — ABNORMAL HIGH (ref 70–99)
Glucose-Capillary: 95 mg/dL (ref 70–99)

## 2012-06-29 LAB — HEMOGLOBIN AND HEMATOCRIT, BLOOD: HCT: 31.1 % — ABNORMAL LOW (ref 36.0–46.0)

## 2012-06-29 MED ORDER — ENOXAPARIN SODIUM 40 MG/0.4ML ~~LOC~~ SOLN: 40.0000 mg | SUBCUTANEOUS | Status: DC

## 2012-06-29 MED ORDER — OXYCODONE-ACETAMINOPHEN 5-325 MG PO TABS: 1.0000 | ORAL_TABLET | ORAL | Status: DC | PRN

## 2012-06-29 NOTE — Clinical Social Work Placement (Signed)
     Clinical Social Work Department CLINICAL SOCIAL WORK PLACEMENT NOTE 06/29/2012  Patient:  Stacey Nelson, Stacey Nelson  Account Number:  [id number] Admit date:  06/28/2012  Clinical Social Worker:  Becky Sax, LCSW  Date/time:  06/29/2012 12:00 M  Clinical Social Work is seeking post-discharge placement for this patient at the following level of care:   SKILLED NURSING   (*CSW will update this form in Epic as items are completed)   06/29/2012  Patient/family provided with [facility] Department of Clinical Social Works list of facilities offering this level of care within the geographic area requested by the patient (or if unable, by the patients family).  06/29/2012  Patient/family informed of their freedom to choose among providers that offer the needed level of care, that participate in Medicare, Medicaid or managed care program needed by the patient, have an available bed and are willing to accept the patient.  06/29/2012  Patient/family informed of [facility] ownership interest in [facility], as well as of the fact that they are under no obligation to receive care at this facility.  PASARR submitted to EDS on 06/29/2012 PASARR number received from EDS on 06/29/2012  FL2 transmitted to all facilities in geographic area requested by pt/family on  06/29/2012 FL2 transmitted to all facilities within larger geographic area on 06/29/2012  Patient informed that his/her managed care company has contracts with or will negotiate with  certain facilities, including the following:     Patient/family informed of bed offers received:  06/29/2012 Patient chooses bed at [facility] Physician recommends and patient chooses bed at  [facility]  Patient to be transferred to [facility] on  06/29/2012 Patient to be transferred to facility by [facility]  The following physician request were entered in Epic:   Additional Comments:

## 2012-06-29 NOTE — Progress Notes (Signed)
Patient cleared for discharge. Blue medicare auth obtained. Accepted at [facility]. Packet copied and placed in [place]. [facility] called for transportation.  Stacey Nelson C. Shanah Guimaraes MSW, LCSW [phone]

## 2012-06-29 NOTE — Progress Notes (Signed)
Pt for d/c to SNF[facility] today. IV d/c'd. Dressing CDI to R knee/leg. MD orders to change dressing to R leg starting tomorrow (POD 2). Dau at bedside to assist with d/c & aware of transfer to SNF today. D/C instructions reviewed with pt & dau and states understanding. Awaiting for transport to SNF. Pt medicated for pain for easier transport.

## 2012-06-29 NOTE — Progress Notes (Addendum)
Subjective: 1 Day Post-Op Procedure(s) (LRB): REPAIR QUADRICEP TENDON (Right) Patient reports pain as 4 on 0-10 scale.    Objective: Vital signs in last 24 hours: Temp:  [97.6 F (36.4 C)-98.5 F (36.9 C)] 98.5 F (36.9 C) (10/18 0417) Pulse Rate:  [54-94] 62  (10/18 0417) Resp:  [16-21] 16  (10/18 0417) BP: (147-199)/(60-84) 147/66 mmHg (10/18 0417) SpO2:  [96 %-100 %] 99 % (10/18 0417) Weight:  [82.555 kg (182 lb)] 82.555 kg (182 lb) (10/17 1325)  Intake/Output from previous day: 10/17 0701 - 10/18 0700 In: 2815.4 [P.O.:720; I.V.:1990.4; IV Piggyback:105] Out: 0  Intake/Output this shift:     Basename 06/28/12 0805  HGB 10.5*    Basename 06/28/12 0805  WBC 6.5  RBC 3.49*  HCT 31.9*  PLT 346    Basename 06/29/12 0350 06/28/12 0913  NA 135 141  K 3.8 3.8  CL 98 103  CO2 27 27  BUN 12 13  CREATININE 0.73 0.74  GLUCOSE 184* 114*  CALCIUM 8.9 9.7    Basename 06/28/12 0805  LABPT --  INR 1.01    Neurologically intact Intact pulses distally Dorsiflexion/Plantar flexion intact  Assessment/Plan: 1 Day Post-Op Procedure(s) (LRB): REPAIR QUADRICEP TENDON (Right) Up with therapy NWB right leg. Brace pending. Will probably need SNF due to weight bearing status. Check HGB and UA.   Coty Student C 06/29/2012, 7:54 AM  No dysuria or frequency will D/C UA as no foley. First UA contaminated

## 2012-06-29 NOTE — Op Note (Signed)
Stacey Nelson, DOCTER NO.:  [id number]  MEDICAL RECORD NO.:  [id number]  LOCATION:  1609                         FACILITY:  [facility]  PHYSICIAN:  Jene Every, M.D.    DATE OF BIRTH:  Jul 11, 1926  DATE OF PROCEDURE:  06/28/2012 DATE OF DISCHARGE:                              OPERATIVE REPORT   PREOPERATIVE DIAGNOSIS:  Quadriceps tendon tear, right knee.  POSTOPERATIVE DIAGNOSIS:  Quadriceps tendon tear, right knee with avulsion from the patella with patellar avulsion fracture, loose bodies, knee hematoma.  PROCEDURES PERFORMED: 1. Open evacuation of hematoma of the knee with removal of loose     bodies. 2. Repair of quadriceps avulsion from the superior pole of the patella     utilizing FiberWire drill holes through the patella.  ANESTHESIA:  General.  ASSISTANT:  [name]. Su Hilt, [place], who was utilized for retraction, holding of the FiberWire, advancing of the tendon to the bed of the patella while suturing and assistance with guidance in drilling.  ANESTHESIA:  General.  HISTORY:  An 78 year old with avulsion of the quadriceps tendon, inability to ambulate, extending, the knee was indicated for repair.  X- rays negative, palpable defect, indicated for repair.  Risks and benefits were discussed including bleeding, infection, inability to bend the knee, arthrofibrosis, DVT, PE, and anesthetic complications, etc.  TECHNIQUE:  With the patient in supine position after induction of adequate general anesthesia, 2 g of Kefzol, the right lower extremity was prepped and draped in usual sterile fashion.  No tourniquet was utilized.  The knee was slightly flexed.  We made an incision in the inferior pole of the patella to the superior pole of the patella about 10 cm above that.  Subcutaneous tissue was dissected.  Electrocautery was utilized to achieve hemostasis.  A complete tear of the quadriceps tendon from the superior pole of the patella was noted with  small fragments, avulsed.  Small fragments were noted at the end of the joint with large hematoma, soft tissue portions of the quadriceps tendon within the joint.  We debrided the joint, irrigated it copiously, removed the loose bodies.  A bed in the superior pole of the patella, good cancellous tissues.  The ends of the quadriceps tendon, then we used a #2 FiberWire and with the Krackow-type stitch, advanced it up into the tendon approximately 10 cm on two separate suture lines, so that poor leading edges were at the distal part of the quadriceps tendon.  We then add three parallel drill holes in the patella parallel to the joint palpating the inferior surface.  Splitting the incision of the patellar ligament was performed distally.  We used the two inside sutures through the center  and the outer sutures through the outer holes.  With the center once, we then placed underneath the tendon.  We advanced the quadriceps tendon to the bed of the bone with the knee in slight hyperextension and tight it in the inferior pole of the patella with good surgical knot and the redundant suture was then removed.  Skip Mayer was utilized to pull the quadriceps tendon into the bed of the repair, while I tied a good surgical knot on  the inferior pole of the patella.  Next, we oversewed the retinaculum with #1 Vicryl interrupted figure-of-eight sutures as well as the quadriceps tendon, the periosteum and the superior pole of the patella.  We split the patellar ligament distally, repaired with 2-0 Vicryl simple sutures, side to side, subcu with 0 and 2-0 Vicryl simple sutures, and we irrigated throughout.  We closed the skin with small stapler with the knee in 10 degrees of flexion.  The wound was then dressed sterilely. We had infiltrated the deep subcutaneous tissue with Marcaine and epinephrine as well as the joint.  After a sterile dressing, I placed her in immobilizer, extubated without difficulty,  and transported to the recovery room in satisfactory condition.  The patient tolerated the procedure well.  No complications.  Assistant Skip Mayer, [place].  Blood loss was 20 mL.     Jene Every, M.D.     Cordelia Pen  D:  06/28/2012  T:  06/29/2012  Job:  [id number]

## 2012-06-29 NOTE — Clinical Social Work Psychosocial (Unsigned)
     Clinical Social Work Department BRIEF PSYCHOSOCIAL ASSESSMENT 06/29/2012  Patient:  Stacey Nelson, Stacey Nelson     Account Number:  [id number]     Admit date:  06/28/2012  Clinical Social Worker:  Hattie Perch  Date/Time:  06/29/2012 12:00 M  Referred by:  Physician  Date Referred:  06/29/2012 Referred for  SNF Placement   Other Referral:   Interview type:  Patient Other interview type:   daughter at bedside    PSYCHOSOCIAL DATA Living Status:  ALONE Admitted from facility:   Level of care:   Primary support name:  shirlie rorie Primary support relationship to patient:  CHILD, ADULT Degree of support available:   good    CURRENT CONCERNS Current Concerns  Post-Acute Placement   Other Concerns:    SOCIAL WORK ASSESSMENT / PLAN CSW  met with patient. patient is alert and oriented X3. patient in need of snf placement. patient has blue medicare. patient agreeable to snf and requesting [facility].   Assessment/plan status:   Other assessment/ plan:   Information/referral to community resources:    PATIENTS/FAMILYS RESPONSE TO PLAN OF CARE: patient agreeable to discharge to [facility] today.

## 2012-06-29 NOTE — Discharge Summary (Signed)
Physician Discharge Summary   Patient ID: Stacey Nelson MRN: [id number] DOB/AGE: 1926/03/14 78 y.o.  Admit date: 06/28/2012 Discharge date: 06/29/2012  Primary Diagnosis: Quadriceps tendon tear  Admission Diagnoses: Same Past Medical History  Diagnosis Date  . Hypertension   . Diabetes mellitus   . Glaucoma    Discharge Diagnoses:   Principal Problem:  *Quadriceps tendon rupture  Estimated Body mass index is 32.24 kg/(m^2) as calculated from the following:   Height as of this encounter: 5\' 3" (1.6 m).   Weight as of this encounter: 182 lb(82.555 kg).  Classification of overweight in adults according to BMI (WHO, 1998)   Procedure:  Procedure(s) (LRB): REPAIR QUADRICEP TENDON (Right)   Consults: None  HPI: See notes Laboratory Data: Hospital Outpatient Visit on 03/06/2010  Component Date Value Range Status  . Sodium 03/06/2010 138  135 - 145 mEq/L Final  . Potassium 03/06/2010 3.6  3.5 - 5.1 mEq/L Final  . Chloride 03/06/2010 98  96 - 112 mEq/L Final  . CO2 03/06/2010 29  19 - 32 mEq/L Final  . Glucose, Bld 03/06/2010 91  70 - 99 mg/dL Final  . BUN 08/65/7846 11  6 - 23 mg/dL Final  . Creatinine, Ser 03/06/2010 0.72  0.4 - 1.2 mg/dL Final  . Calcium 96/29/5284 10.0  8.4 - 10.5 mg/dL Final  . GFR calc non Af Amer 03/06/2010 >60  >60 mL/min Final  . GFR calc Af Amer 03/06/2010   >60 mL/min Final                   Value:>60                                The eGFR has been calculated                         using the MDRD equation.                         This calculation has not been                         validated in all clinical                         situations.                         eGFR's persistently                         <60 mL/min signify                         possible Chronic Kidney Disease.  . WBC 03/06/2010 7.3  4.0 - 10.5 K/uL Final  . RBC 03/06/2010 4.14  3.87 - 5.11 MIL/uL Final  . Hemoglobin 03/06/2010 13.0  12.0 - 15.0 g/dL Final  . HCT  13/24/4010 38.5  36.0 - 46.0 % Final  . MCV 03/06/2010 92.9  78.0 - 100.0 fL Final  . MCH 03/06/2010 31.4  26.0 - 34.0 pg Final  . MCHC 03/06/2010 33.8  30.0 - 36.0 g/dL Final  . RDW 27/25/3664 13.8  11.5 - 15.5 % Final  . Platelets 03/06/2010 249  150 - 400 K/uL Final  .  MRSA by PCR 03/06/2010   NEGATIVE Final                   Value:NEGATIVE                                The GeneXpert MRSA Assay (FDA                         approved for NASAL specimens                         only), is one component of a                         comprehensive MRSA colonization                         surveillance program. It is not                         intended to diagnose MRSA                         infection nor to guide or                         monitor treatment for                         MRSA infections.  . Glucose-Capillary 03/06/2010 90  70 - 99 mg/dL Final  . Glucose-Capillary 03/06/2010 86  70 - 99 mg/dL Final    Basename 16/10/96 0805  HGB 10.5*    Basename 06/28/12 0805  WBC 6.5  RBC 3.49*  HCT 31.9*  PLT 346    Basename 06/29/12 0350 06/28/12 0913  NA 135 141  K 3.8 3.8  CL 98 103  CO2 27 27  BUN 12 13  CREATININE 0.73 0.74  GLUCOSE 184* 114*  CALCIUM 8.9 9.7    Basename 06/28/12 0805  LABPT --  INR 1.01    X-Rays:Dg Knee Complete 4 Views Right  06/02/2012  *RADIOLOGY REPORT*  Clinical Data: Twisting injury.  RIGHT KNEE - COMPLETE 4+ VIEW  Comparison: None.  Findings: No fracture or dislocation.  Prominent prepatellar edema/hematoma.  Vascular calcifications.  Bone infarct distal right femoral shaft may be present.  IMPRESSION: Post traumatic prepatellar bursitis/hematoma.  No underlying fracture or dislocation.   Original Report Authenticated By: Fuller Canada, M.D.     EKG:No orders found for this or any previous visit.   Hospital Course: Tolerated procedure well. No complications  Discharge Medications: Prior to Admission medications   Medication Sig  Start Date End Date Taking? Authorizing Provider  acetaminophen (TYLENOL) 500 MG tablet Take 500 mg by mouth every 6 (six) hours as needed. For pain   Yes Historical Provider, MD  bimatoprost (LUMIGAN) 0.03 % ophthalmic solution Place 1 drop into the right eye at bedtime.   Yes Historical Provider, MD  brinzolamide (AZOPT) 1 % ophthalmic suspension Place 1 drop into the right eye 3 (three) times daily.   Yes Historical Provider, MD  ezetimibe (ZETIA) 10 MG tablet Take 10 mg by mouth at bedtime.    Yes Historical Provider, MD  insulin glargine (LANTUS) 100 UNIT/ML  injection Inject 70 Units into the skin every morning.    Yes Historical Provider, MD  nebivolol (BYSTOLIC) 5 MG tablet Take 5 mg by mouth at bedtime.    Yes Historical Provider, MD  oxyCODONE-acetaminophen (PERCOCET/ROXICET) 5-325 MG per tablet Take 1 tablet by mouth every 4 (four) hours as needed for pain. 06/03/12  Yes Flint Melter, MD  pravastatin (PRAVACHOL) 80 MG tablet Take 80 mg by mouth daily.   Yes Historical Provider, MD  quinapril-hydrochlorothiazide (ACCURETIC) 20-12.5 MG per tablet Take 1 tablet by mouth every morning.    Yes Historical Provider, MD  sitaGLIPtin (JANUVIA) 100 MG tablet Take 100 mg by mouth every morning.    Yes Historical Provider, MD  enoxaparin (LOVENOX) 40 MG/0.4ML injection Inject 0.4 mLs (40 mg total) into the skin daily. 06/29/12   Javier Docker, MD  Multiple Vitamin (MULTIVITAMIN WITH MINERALS) TABS Take 1 tablet by mouth daily.    Historical Provider, MD  oxyCODONE-acetaminophen (PERCOCET/ROXICET) 5-325 MG per tablet Take 1 tablet by mouth every 4 (four) hours as needed. 06/29/12   Javier Docker, MD    Diet: Diabetic diet Activity:NWB Right leg  Follow-up:in 2 weeks Dr. Shelle Iron Disposition - Skilled nursing facility Discharged Condition: good   Discharge Orders    Future Orders Please Complete By Expires   Diet - low sodium heart healthy      Diet - low sodium heart healthy      Call MD /  Call 911      Comments:   If you experience chest pain or shortness of breath, CALL 911 and be transported to the hospital emergency room.  If you develope a fever above 101 F, pus (white drainage) or increased drainage or redness at the wound, or calf pain, call your surgeon's office.   Constipation Prevention      Comments:   Drink plenty of fluids.  Prune juice may be helpful.  You may use a stool softener, such as Colace (over the counter) 100 mg twice a day.  Use MiraLax (over the counter) for constipation as needed.   Increase activity slowly as tolerated      Call MD / Call 911      Comments:   If you experience chest pain or shortness of breath, CALL 911 and be transported to the hospital emergency room.  If you develope a fever above 101 F, pus (white drainage) or increased drainage or redness at the wound, or calf pain, call your surgeon's office.   Constipation Prevention      Comments:   Drink plenty of fluids.  Prune juice may be helpful.  You may use a stool softener, such as Colace (over the counter) 100 mg twice a day.  Use MiraLax (over the counter) for constipation as needed.   Increase activity slowly as tolerated      Change dressing      Comments:   Change dressing on  POD #2b, then change the dressing daily with sterile 4 x 4 inch gauze dressing and apply TED hose.  You may clean the incision prior to redressing.   TED hose      Comments:   Use stockings (TED hose) for 2weeks on both leg(s).  You may remove them at night for sleeping.       Medication List     As of 06/29/2012 12:35 PM    TAKE these medications         acetaminophen 500 MG tablet  Commonly known as: TYLENOL   Take 500 mg by mouth every 6 (six) hours as needed. For pain      bimatoprost 0.03 % ophthalmic solution   Commonly known as: LUMIGAN   Place 1 drop into the right eye at bedtime.      brinzolamide 1 % ophthalmic suspension   Commonly known as: AZOPT   Place 1 drop into the right eye  3 (three) times daily.      enoxaparin 40 MG/0.4ML injection   Commonly known as: LOVENOX   Inject 0.4 mLs (40 mg total) into the skin daily.      ezetimibe 10 MG tablet   Commonly known as: ZETIA   Take 10 mg by mouth at bedtime.      insulin glargine 100 UNIT/ML injection   Commonly known as: LANTUS   Inject 70 Units into the skin every morning.      multivitamin with minerals Tabs   Take 1 tablet by mouth daily.      nebivolol 5 MG tablet   Commonly known as: BYSTOLIC   Take 5 mg by mouth at bedtime.      oxyCODONE-acetaminophen 5-325 MG per tablet   Commonly known as: PERCOCET/ROXICET   Take 1 tablet by mouth every 4 (four) hours as needed for pain.      oxyCODONE-acetaminophen 5-325 MG per tablet   Commonly known as: PERCOCET/ROXICET   Take 1 tablet by mouth every 4 (four) hours as needed.      pravastatin 80 MG tablet   Commonly known as: PRAVACHOL   Take 80 mg by mouth daily.      quinapril-hydrochlorothiazide 20-12.5 MG per tablet   Commonly known as: ACCURETIC   Take 1 tablet by mouth every morning.      sitaGLIPtin 100 MG tablet   Commonly known as: JANUVIA   Take 100 mg by mouth every morning.           Follow-up Information    Follow up with Anjel Perfetti C, MD. In 2 weeks.   Contact information:   [facility] [address], [address] [place] [address] [phone]          Signed: Javier Docker 06/29/2012, 12:35 PM

## 2012-06-29 NOTE — Evaluation (Signed)
Physical Therapy Evaluation Patient Details Name: Stacey Nelson MRN: [id number] DOB: 1926-03-07 Today's Date: 06/29/2012 Time: 4098-1191 PT Time Calculation (min): 15 min  PT Assessment / Plan / Recommendation Clinical Impression  Pt s/p R quad tendon repair after falling on wet kitchen floor at home.  Pt would benefit from acute PT services in order to improve independence with transfers, ambulation, w/c mobility while maintaining precautions to prepare for d/c to next venue.    PT Assessment  Patient needs continued PT services    Follow Up Recommendations  Post acute inpatient    Does the patient have the potential to tolerate intense rehabilitation   No, Recommend SNF  Barriers to Discharge        Equipment Recommendations  Rolling walker with 5" wheels    Recommendations for Other Services     Frequency Min 4X/week    Precautions / Restrictions Precautions Precautions: Knee;Fall Required Braces or Orthoses: Other Brace/Splint (bledsoe) Other Brace/Splint: brace on at all times Restrictions Weight Bearing Restrictions: Yes RLE Weight Bearing: Non weight bearing   Pertinent Vitals/Pain 3/10 R knee, pt reports premedicated, repositioned, ice applied      Mobility  Bed Mobility Bed Mobility: Supine to Sit;Sitting - Scoot to Edge of Bed Supine to Sit: 4: Min assist Sitting - Scoot to [place] of Bed: 4: Min assist Details for Bed Mobility Assistance: verbal cues for technique, increased time to perform, assist for R LE support/assist Transfers Transfers: Stand to Sit;Sit to Stand Sit to Stand: 4: Min assist;With upper extremity assist;From bed Stand to Sit: 4: Min assist;With upper extremity assist;To chair/3-in-1 Details for Transfer Assistance: verbal cues for safe technique Ambulation/Gait Ambulation/Gait Assistance: 4: Min guard Ambulation Distance (Feet): 4 Feet Assistive device: Rolling walker Ambulation/Gait Assistance Details: pt ambulated forward a few  steps with increased verbal cues for NWB (keeping R LE forward to cue for NWB) and safe use of RW, pt placing foot on floor occasionally so stopped ambulation and pulled recliner behind pt, pt asked: "don't ya'll have a w/c?" Gait Pattern: Step-to pattern    Shoulder Instructions     Exercises     PT Diagnosis: Difficulty walking;Acute pain  PT Problem List: Decreased activity tolerance;Decreased strength;Decreased mobility;Decreased knowledge of precautions;Pain;Decreased knowledge of use of DME PT Treatment Interventions: DME instruction;Gait training;Functional mobility training;Patient/family education;Therapeutic exercise;Therapeutic activities;Wheelchair mobility training   PT Goals Acute Rehab PT Goals PT Goal Formulation: With patient Time For Goal Achievement: 07/06/12 Potential to Achieve Goals: Good Pt will go Supine/Side to Sit: with supervision PT Goal: Supine/Side to Sit - Progress: Goal set today Pt will go Sit to Supine/Side: with supervision PT Goal: Sit to Supine/Side - Progress: Goal set today Pt will go Sit to Stand: with supervision PT Goal: Sit to Stand - Progress: Goal set today Pt will go Stand to Sit: with supervision PT Goal: Stand to Sit - Progress: Goal set today Pt will Ambulate: 16 - 50 feet;with supervision;with rolling walker PT Goal: Ambulate - Progress: Goal set today Pt will Propel Wheelchair: > 150 feet;with modified independence PT Goal: Propel Wheelchair - Progress: Goal set today  Visit Information  Last PT Received On: 06/29/12 Assistance Needed: +1    Subjective Data  Subjective: "I'm not dead yet."   Prior Functioning  Home Living Lives With: Daughter Type of Home: House Home Access: Stairs to enter [employer] of Steps: 2-3 Home Layout: One level Home Adaptive Equipment: Environmental consultant - four wheeled;Bedside commode/3-in-1 Prior Function Level of Independence: Independent with  assistive device(s) Communication Communication:  No difficulties    Cognition  Overall Cognitive Status: Appears within functional limits for tasks assessed/performed Arousal/Alertness: Awake/alert Orientation Level: Appears intact for tasks assessed Behavior During Session: [facility] for tasks performed    Extremity/Trunk Assessment Right Upper Extremity Assessment RUE ROM/Strength/Tone: [facility] for tasks assessed Left Upper Extremity Assessment LUE ROM/Strength/Tone: [facility] for tasks assessed Right Lower Extremity Assessment RLE ROM/Strength/Tone: Due to precautions;Unable to fully assess (able to move ankle, wiggle toes) Left Lower Extremity Assessment LLE ROM/Strength/Tone: [facility] for tasks assessed   Balance    End of Session PT - End of Session Equipment Utilized During Treatment: Other (comment) (bledsoe) Activity Tolerance: Patient limited by fatigue Patient left: in chair;with call bell/phone within reach  GP Functional Assessment Tool Used: clinical judgement Functional Limitation: Mobility: Walking and moving around Mobility: Walking and Moving Around Current Status (Z6109): At least 20 percent but less than 40 percent impaired, limited or restricted Mobility: Walking and Moving Around Goal Status [id number]): At least 1 percent but less than 20 percent impaired, limited or restricted   Saulo Anthis,KATHrine E 06/29/2012, 10:37 AM Pager: [phone]

## 2014-06-17 ENCOUNTER — Other Ambulatory Visit: Payer: Self-pay | Admitting: Endocrinology

## 2014-06-17 DIAGNOSIS — R1011 Right upper quadrant pain: Secondary | ICD-10-CM

## 2014-06-23 ENCOUNTER — Ambulatory Visit
Admission: RE | Admit: 2014-06-23 | Discharge: 2014-06-23 | Disposition: A | Discharge status: Home / Self Care | Payer: Medicare Other | Source: Ambulatory Visit | Attending: Endocrinology | Admitting: Endocrinology

## 2014-06-23 DIAGNOSIS — R1011 Right upper quadrant pain: Secondary | ICD-10-CM

## 2014-06-23 MED ORDER — IOHEXOL 300 MG/ML  SOLN
100.0000 mL | Freq: Once | INTRAMUSCULAR | Status: AC | PRN
  Administered 2014-06-23: 100 mL via INTRAVENOUS

## 2015-02-16 ENCOUNTER — Other Ambulatory Visit: Payer: Self-pay | Admitting: Endocrinology

## 2015-02-16 DIAGNOSIS — R479 Unspecified speech disturbances: Secondary | ICD-10-CM

## 2015-02-16 DIAGNOSIS — R269 Unspecified abnormalities of gait and mobility: Secondary | ICD-10-CM

## 2015-02-18 ENCOUNTER — Ambulatory Visit
Admission: RE | Admit: 2015-02-18 | Discharge: 2015-02-18 | Disposition: A | Discharge status: Home / Self Care | Payer: Medicare Other | Source: Ambulatory Visit | Attending: Endocrinology | Admitting: Endocrinology

## 2015-02-18 DIAGNOSIS — R269 Unspecified abnormalities of gait and mobility: Secondary | ICD-10-CM

## 2015-02-18 DIAGNOSIS — R479 Unspecified speech disturbances: Secondary | ICD-10-CM

## 2015-02-18 MED ORDER — IOPAMIDOL (ISOVUE-300) INJECTION 61%
75.0000 mL | Freq: Once | INTRAVENOUS | Status: AC | PRN
  Administered 2015-02-18: 75 mL via INTRAVENOUS

## 2016-01-21 DIAGNOSIS — E118 Type 2 diabetes mellitus with unspecified complications: Secondary | ICD-10-CM | POA: Diagnosis not present

## 2016-01-21 DIAGNOSIS — E7800 Pure hypercholesterolemia, unspecified: Secondary | ICD-10-CM | POA: Diagnosis not present

## 2016-01-21 DIAGNOSIS — I10 Essential (primary) hypertension: Secondary | ICD-10-CM | POA: Diagnosis not present

## 2016-01-28 DIAGNOSIS — E789 Disorder of lipoprotein metabolism, unspecified: Secondary | ICD-10-CM | POA: Diagnosis not present

## 2016-01-28 DIAGNOSIS — E118 Type 2 diabetes mellitus with unspecified complications: Secondary | ICD-10-CM | POA: Diagnosis not present

## 2016-01-28 DIAGNOSIS — I10 Essential (primary) hypertension: Secondary | ICD-10-CM | POA: Diagnosis not present

## 2016-02-04 DIAGNOSIS — H353134 Nonexudative age-related macular degeneration, bilateral, advanced atrophic with subfoveal involvement: Secondary | ICD-10-CM | POA: Diagnosis not present

## 2016-02-04 DIAGNOSIS — Z961 Presence of intraocular lens: Secondary | ICD-10-CM | POA: Diagnosis not present

## 2016-02-04 DIAGNOSIS — H401133 Primary open-angle glaucoma, bilateral, severe stage: Secondary | ICD-10-CM | POA: Diagnosis not present

## 2016-02-04 DIAGNOSIS — H21512 Anterior synechiae (iris), left eye: Secondary | ICD-10-CM | POA: Diagnosis not present

## 2016-02-09 DIAGNOSIS — E119 Type 2 diabetes mellitus without complications: Secondary | ICD-10-CM | POA: Diagnosis not present

## 2016-02-25 DIAGNOSIS — H401133 Primary open-angle glaucoma, bilateral, severe stage: Secondary | ICD-10-CM | POA: Diagnosis not present

## 2016-06-21 DIAGNOSIS — E7800 Pure hypercholesterolemia, unspecified: Secondary | ICD-10-CM | POA: Diagnosis not present

## 2016-06-21 DIAGNOSIS — Z79899 Other long term (current) drug therapy: Secondary | ICD-10-CM | POA: Diagnosis not present

## 2016-06-21 DIAGNOSIS — E118 Type 2 diabetes mellitus with unspecified complications: Secondary | ICD-10-CM | POA: Diagnosis not present

## 2016-06-21 DIAGNOSIS — Z0000 Encounter for general adult medical examination without abnormal findings: Secondary | ICD-10-CM | POA: Diagnosis not present

## 2016-06-21 DIAGNOSIS — I10 Essential (primary) hypertension: Secondary | ICD-10-CM | POA: Diagnosis not present

## 2016-06-28 DIAGNOSIS — E118 Type 2 diabetes mellitus with unspecified complications: Secondary | ICD-10-CM | POA: Diagnosis not present

## 2016-06-28 DIAGNOSIS — E789 Disorder of lipoprotein metabolism, unspecified: Secondary | ICD-10-CM | POA: Diagnosis not present

## 2016-06-28 DIAGNOSIS — I10 Essential (primary) hypertension: Secondary | ICD-10-CM | POA: Diagnosis not present

## 2016-06-29 DIAGNOSIS — N390 Urinary tract infection, site not specified: Secondary | ICD-10-CM | POA: Diagnosis not present

## 2016-06-29 DIAGNOSIS — H401133 Primary open-angle glaucoma, bilateral, severe stage: Secondary | ICD-10-CM | POA: Diagnosis not present

## 2016-06-29 DIAGNOSIS — H04123 Dry eye syndrome of bilateral lacrimal glands: Secondary | ICD-10-CM | POA: Diagnosis not present

## 2016-06-29 DIAGNOSIS — H16213 Exposure keratoconjunctivitis, bilateral: Secondary | ICD-10-CM | POA: Diagnosis not present

## 2016-06-29 DIAGNOSIS — E118 Type 2 diabetes mellitus with unspecified complications: Secondary | ICD-10-CM | POA: Diagnosis not present

## 2016-07-12 DIAGNOSIS — L602 Onychogryphosis: Secondary | ICD-10-CM | POA: Diagnosis not present

## 2016-07-12 DIAGNOSIS — I739 Peripheral vascular disease, unspecified: Secondary | ICD-10-CM | POA: Diagnosis not present

## 2016-07-12 DIAGNOSIS — L84 Corns and callosities: Secondary | ICD-10-CM | POA: Diagnosis not present

## 2016-07-12 DIAGNOSIS — E1151 Type 2 diabetes mellitus with diabetic peripheral angiopathy without gangrene: Secondary | ICD-10-CM | POA: Diagnosis not present

## 2016-08-08 DIAGNOSIS — H6120 Impacted cerumen, unspecified ear: Secondary | ICD-10-CM | POA: Diagnosis not present

## 2016-08-13 IMAGING — CT CT HEAD WO/W CM
1 of 2 series · 13 of 30 positions shown, 17 images · IV contrast (iopamidol)
Comparison: None.

CLINICAL DATA: Weakness, occasional confusion, slurred speech in
abnormal gait.

EXAM:
CT HEAD WITHOUT AND WITH CONTRAST
TECHNIQUE: Contiguous axial images were obtained from the base of the skull
through the vertex without and with intravenous contrast
CONTRAST:  75mL EFM2BM-QPP IOPAMIDOL (EFM2BM-QPP) INJECTION 61%

[Series 3: headseq 5.0 h31s · axial · 0.39mm/px · z∈[-124,-4]mm · 13 of 28 slices shown, 17 images]
[im 2/28  brain]
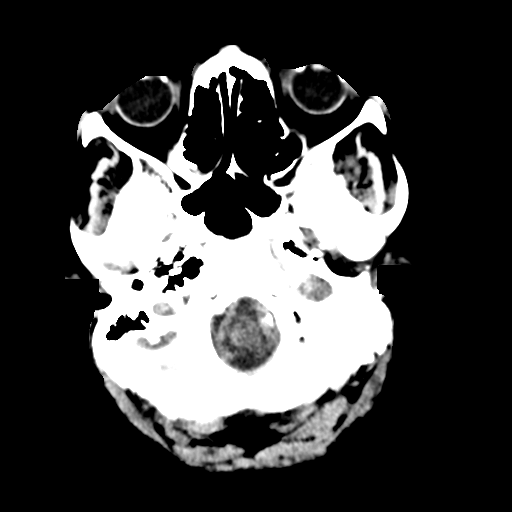
[im 2/28  bone]
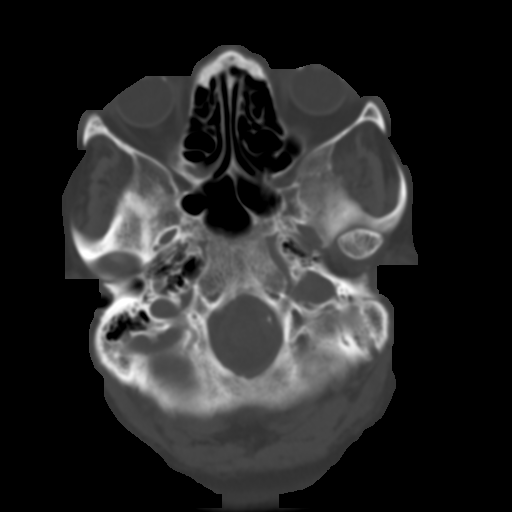
[im 4/28  brain]
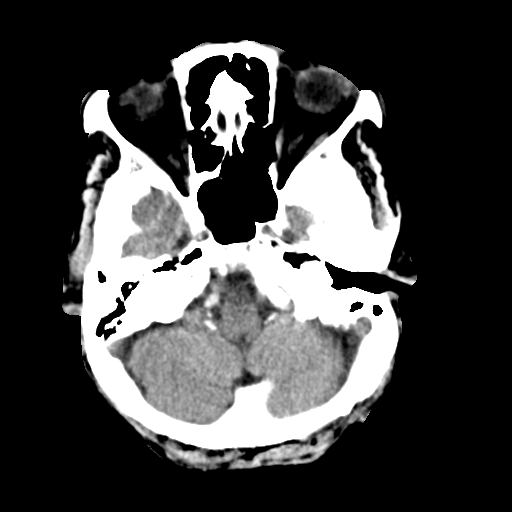
[im 6/28  brain]
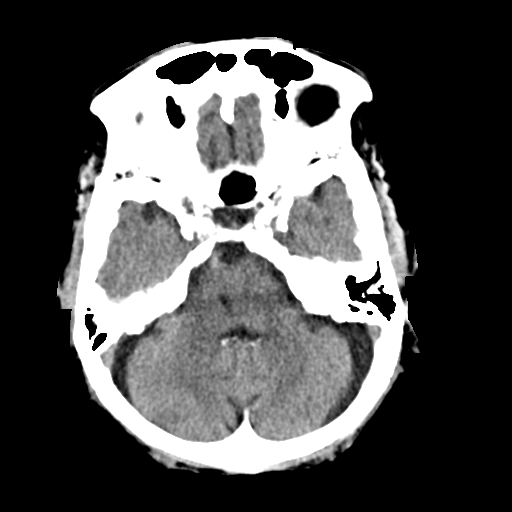
[im 8/28  brain]
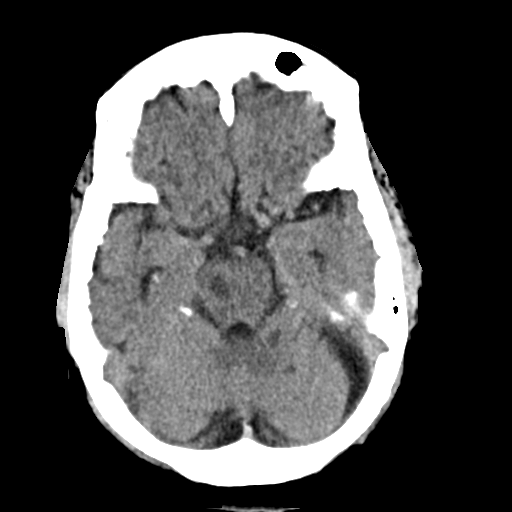
[im 10/28  brain]
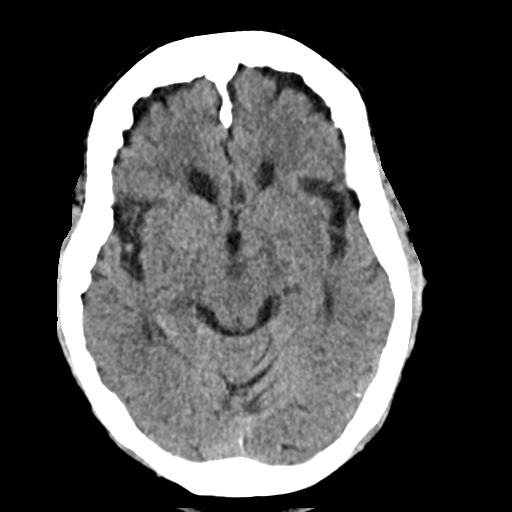
[im 10/28  bone]
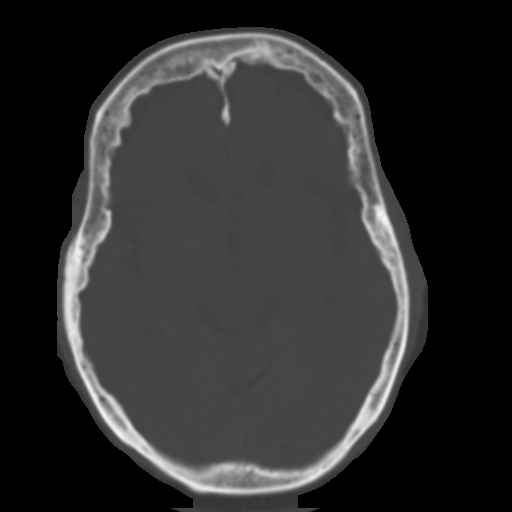
[im 12/28  brain]
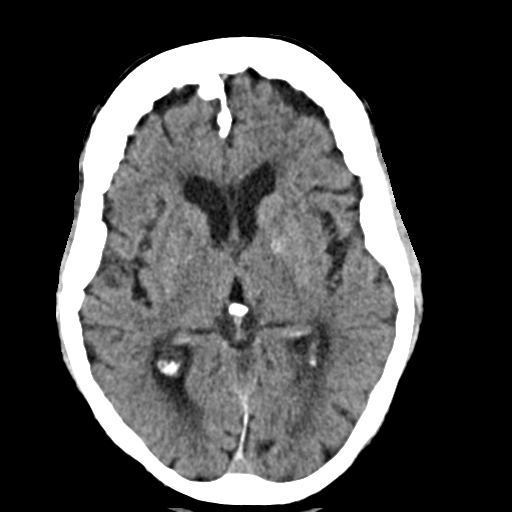
[im 14/28  brain]
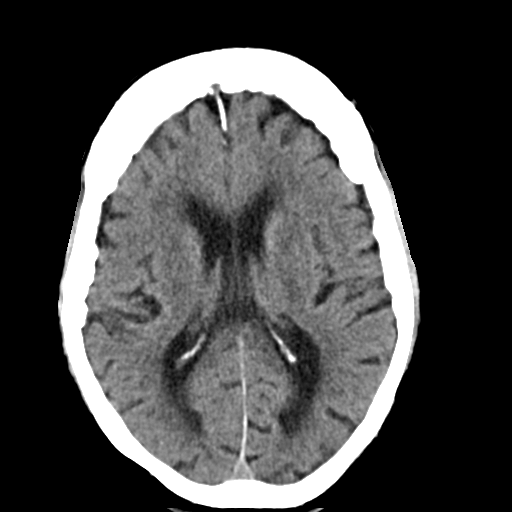
[im 16/28  brain]
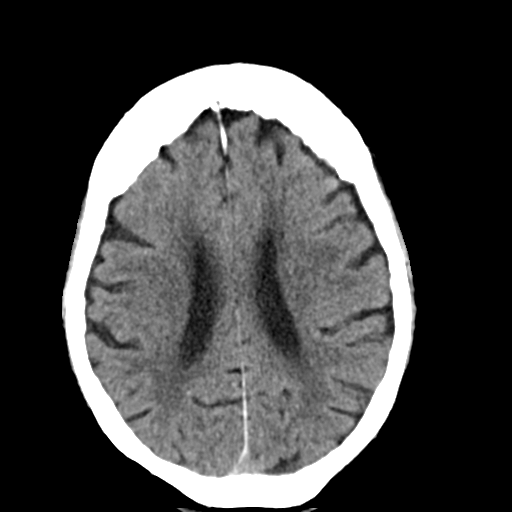
[im 18/28  brain]
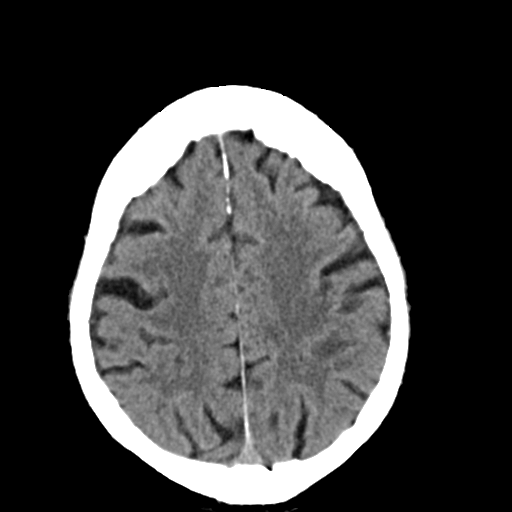
[im 18/28  bone]
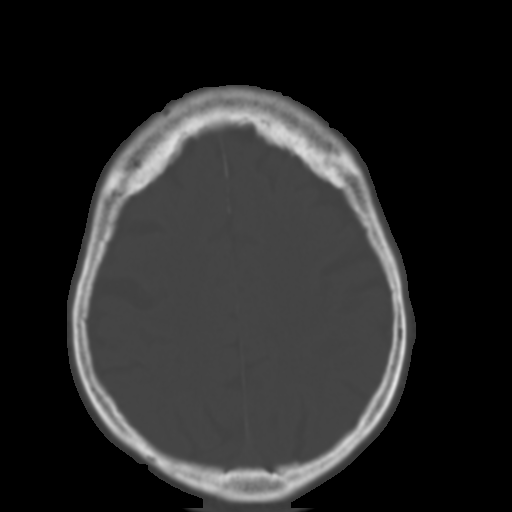
[im 20/28  brain]
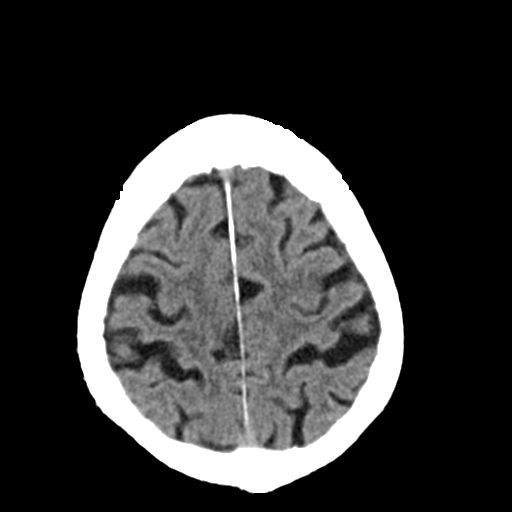
[im 22/28  brain]
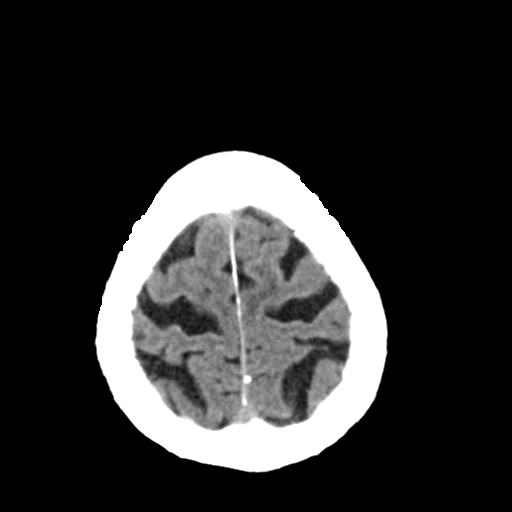
[im 24/28  brain]
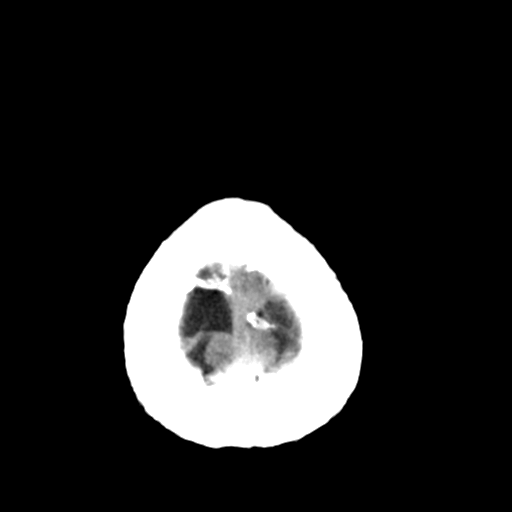
[im 26/28  brain]
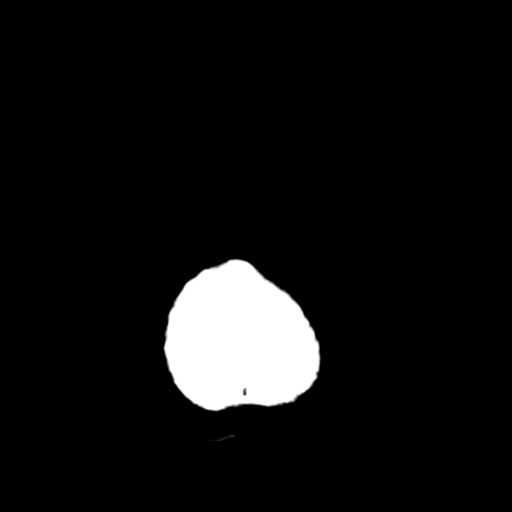
[im 26/28  bone]
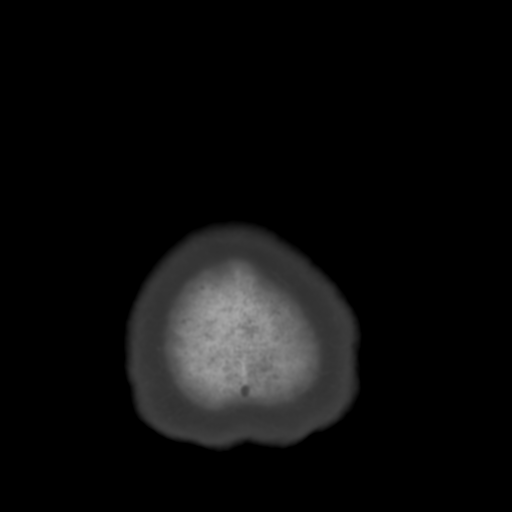

[13 of 30 positions shown; findings below may reference images not displayed]

FINDINGS: Skull and Sinuses: Asymmetric appearance the nasopharynx is likely
from volume averaging. No mass is suspected.

Incidental frontal hyperostosis.

Negative for fracture or destructive process. The mastoids, middle
ears, and imaged paranasal sinuses are clear.

Orbits: Bilateral cataract resection with neighboring senescent
calcifications. No acute or significant findings.

Brain: Bilateral cerebellar and right pontine lacunar infarcts which
are well-defined and presumably remote. Probable small bilateral
lacunar infarcts in the thalamus was well. Ischemic gliosis noted in
the periventricular white matter. There is generalized brain
atrophy.

No acute cortically based infarct, hemorrhage, hydrocephalus, or
mass lesion. No evidence for major vessel occlusion.
IMPRESSION: 1. Chronic small vessel disease with multiple small vessel infarcts
in the bilateral cerebellum, right pons, and bilateral thalamus.
None of these infarcts are clearly acute, but given patient's
symptoms for 6 days note that one or multiple of these infarcts
could be subacute.
2. Generalized brain atrophy.

## 2016-08-18 DIAGNOSIS — H6120 Impacted cerumen, unspecified ear: Secondary | ICD-10-CM | POA: Diagnosis not present

## 2016-09-14 DIAGNOSIS — E118 Type 2 diabetes mellitus with unspecified complications: Secondary | ICD-10-CM | POA: Diagnosis not present

## 2016-09-15 DIAGNOSIS — E789 Disorder of lipoprotein metabolism, unspecified: Secondary | ICD-10-CM | POA: Diagnosis not present

## 2016-09-15 DIAGNOSIS — E118 Type 2 diabetes mellitus with unspecified complications: Secondary | ICD-10-CM | POA: Diagnosis not present

## 2016-09-15 DIAGNOSIS — I10 Essential (primary) hypertension: Secondary | ICD-10-CM | POA: Diagnosis not present

## 2016-12-07 DIAGNOSIS — I10 Essential (primary) hypertension: Secondary | ICD-10-CM | POA: Diagnosis not present

## 2016-12-07 DIAGNOSIS — E118 Type 2 diabetes mellitus with unspecified complications: Secondary | ICD-10-CM | POA: Diagnosis not present

## 2016-12-14 DIAGNOSIS — E118 Type 2 diabetes mellitus with unspecified complications: Secondary | ICD-10-CM | POA: Diagnosis not present

## 2016-12-14 DIAGNOSIS — L309 Dermatitis, unspecified: Secondary | ICD-10-CM | POA: Diagnosis not present

## 2016-12-14 DIAGNOSIS — I10 Essential (primary) hypertension: Secondary | ICD-10-CM | POA: Diagnosis not present

## 2016-12-20 DIAGNOSIS — E118 Type 2 diabetes mellitus with unspecified complications: Secondary | ICD-10-CM | POA: Diagnosis not present

## 2016-12-28 DIAGNOSIS — Z961 Presence of intraocular lens: Secondary | ICD-10-CM | POA: Diagnosis not present

## 2016-12-28 DIAGNOSIS — H353134 Nonexudative age-related macular degeneration, bilateral, advanced atrophic with subfoveal involvement: Secondary | ICD-10-CM | POA: Diagnosis not present

## 2016-12-28 DIAGNOSIS — H401133 Primary open-angle glaucoma, bilateral, severe stage: Secondary | ICD-10-CM | POA: Diagnosis not present

## 2016-12-28 DIAGNOSIS — H04123 Dry eye syndrome of bilateral lacrimal glands: Secondary | ICD-10-CM | POA: Diagnosis not present

## 2017-02-11 DIAGNOSIS — E118 Type 2 diabetes mellitus with unspecified complications: Secondary | ICD-10-CM | POA: Diagnosis not present

## 2017-03-09 DIAGNOSIS — E118 Type 2 diabetes mellitus with unspecified complications: Secondary | ICD-10-CM | POA: Diagnosis not present

## 2017-03-09 DIAGNOSIS — I10 Essential (primary) hypertension: Secondary | ICD-10-CM | POA: Diagnosis not present

## 2017-03-16 DIAGNOSIS — I10 Essential (primary) hypertension: Secondary | ICD-10-CM | POA: Diagnosis not present

## 2017-03-16 DIAGNOSIS — E118 Type 2 diabetes mellitus with unspecified complications: Secondary | ICD-10-CM | POA: Diagnosis not present

## 2017-03-16 DIAGNOSIS — E789 Disorder of lipoprotein metabolism, unspecified: Secondary | ICD-10-CM | POA: Diagnosis not present

## 2017-03-31 DIAGNOSIS — E118 Type 2 diabetes mellitus with unspecified complications: Secondary | ICD-10-CM | POA: Diagnosis not present

## 2017-05-29 DIAGNOSIS — M25511 Pain in right shoulder: Secondary | ICD-10-CM | POA: Diagnosis not present

## 2017-06-26 DIAGNOSIS — Z0000 Encounter for general adult medical examination without abnormal findings: Secondary | ICD-10-CM | POA: Diagnosis not present

## 2017-06-26 DIAGNOSIS — E7800 Pure hypercholesterolemia, unspecified: Secondary | ICD-10-CM | POA: Diagnosis not present

## 2017-06-26 DIAGNOSIS — E118 Type 2 diabetes mellitus with unspecified complications: Secondary | ICD-10-CM | POA: Diagnosis not present

## 2017-06-26 DIAGNOSIS — D509 Iron deficiency anemia, unspecified: Secondary | ICD-10-CM | POA: Diagnosis not present

## 2017-06-26 DIAGNOSIS — D649 Anemia, unspecified: Secondary | ICD-10-CM | POA: Diagnosis not present

## 2017-06-26 DIAGNOSIS — I10 Essential (primary) hypertension: Secondary | ICD-10-CM | POA: Diagnosis not present

## 2017-06-26 DIAGNOSIS — Z79899 Other long term (current) drug therapy: Secondary | ICD-10-CM | POA: Diagnosis not present

## 2017-06-27 DIAGNOSIS — N390 Urinary tract infection, site not specified: Secondary | ICD-10-CM | POA: Diagnosis not present

## 2017-06-27 DIAGNOSIS — I10 Essential (primary) hypertension: Secondary | ICD-10-CM | POA: Diagnosis not present

## 2017-06-27 DIAGNOSIS — E118 Type 2 diabetes mellitus with unspecified complications: Secondary | ICD-10-CM | POA: Diagnosis not present

## 2017-06-27 DIAGNOSIS — Z0000 Encounter for general adult medical examination without abnormal findings: Secondary | ICD-10-CM | POA: Diagnosis not present

## 2017-07-03 DIAGNOSIS — Z0001 Encounter for general adult medical examination with abnormal findings: Secondary | ICD-10-CM | POA: Diagnosis not present

## 2017-07-03 DIAGNOSIS — E1169 Type 2 diabetes mellitus with other specified complication: Secondary | ICD-10-CM | POA: Diagnosis not present

## 2017-07-03 DIAGNOSIS — B351 Tinea unguium: Secondary | ICD-10-CM | POA: Diagnosis not present

## 2017-07-03 DIAGNOSIS — R062 Wheezing: Secondary | ICD-10-CM | POA: Diagnosis not present

## 2017-07-03 DIAGNOSIS — Z1212 Encounter for screening for malignant neoplasm of rectum: Secondary | ICD-10-CM | POA: Diagnosis not present

## 2017-07-03 DIAGNOSIS — M79675 Pain in left toe(s): Secondary | ICD-10-CM | POA: Diagnosis not present

## 2017-07-06 DIAGNOSIS — Z961 Presence of intraocular lens: Secondary | ICD-10-CM | POA: Diagnosis not present

## 2017-07-06 DIAGNOSIS — H53461 Homonymous bilateral field defects, right side: Secondary | ICD-10-CM | POA: Diagnosis not present

## 2017-07-06 DIAGNOSIS — H04123 Dry eye syndrome of bilateral lacrimal glands: Secondary | ICD-10-CM | POA: Diagnosis not present

## 2017-07-06 DIAGNOSIS — H401133 Primary open-angle glaucoma, bilateral, severe stage: Secondary | ICD-10-CM | POA: Diagnosis not present

## 2017-07-12 ENCOUNTER — Ambulatory Visit (INDEPENDENT_AMBULATORY_CARE_PROVIDER_SITE_OTHER): Payer: Medicare Other | Admitting: Podiatry

## 2017-07-12 VITALS — BP 176/92 | HR 83 | Ht 63.0 in | Wt 153.0 lb

## 2017-07-12 DIAGNOSIS — B351 Tinea unguium: Secondary | ICD-10-CM | POA: Diagnosis not present

## 2017-07-12 DIAGNOSIS — M79609 Pain in unspecified limb: Secondary | ICD-10-CM

## 2017-07-12 NOTE — Progress Notes (Addendum)
Subjective:    Patient ID: Stacey Nelson, female    DOB: 09/09/1926, 83 y.o.   MRN: [id number]  HPI  Chief Complaint  Patient presents with  . Diabetes    Diabetic nail care  . Toe Pain    Left great toenail painful to the touch   83 y.o. female presents with the above complaint. Complains of pain to the left great toe. Denies numbness and tingling in the feet. Denies burning and cramping. Endorses DM. Sees Dr. Renne CriglerPharr. Would like DM shoes.  Past Medical History:  Diagnosis Date  . Diabetes mellitus   . Glaucoma   . Hypertension    Past Surgical History:  Procedure Laterality Date  . ABDOMINAL HYSTERECTOMY  yrs ago  . COLONOSCOPY    . EYE SURGERY  7 yrs ago   cataract surgery both eyes   . HEMORROIDECTOMY    . HERNIA REPAIR  yrs ago   right side  . right eye surgery for glaucoma  2010  . TONSILLECTOMY  yrs ago    Current Outpatient Medications:  .  aspirin EC 81 MG tablet, Take 81 mg by mouth daily., Disp: , Rfl:  .  acetaminophen (TYLENOL) 500 MG tablet, Take 500 mg by mouth every 6 (six) hours as needed. For pain, Disp: , Rfl:  .  bimatoprost (LUMIGAN) 0.03 % ophthalmic solution, Place 1 drop into the right eye at bedtime., Disp: , Rfl:  .  brinzolamide (AZOPT) 1 % ophthalmic suspension, Place 1 drop into the right eye 3 (three) times daily., Disp: , Rfl:  .  enoxaparin (LOVENOX) 40 MG/0.4ML injection, Inject 0.4 mLs (40 mg total) into the skin daily. (Patient not taking: Reported on 07/12/2017), Disp: 14 Syringe, Rfl: 1 .  ezetimibe (ZETIA) 10 MG tablet, Take 10 mg by mouth at bedtime. , Disp: , Rfl:  .  insulin glargine (LANTUS) 100 UNIT/ML injection, Inject 70 Units into the skin every morning. , Disp: , Rfl:  .  Multiple Vitamin (MULTIVITAMIN WITH MINERALS) TABS, Take 1 tablet by mouth daily., Disp: , Rfl:  .  nebivolol (BYSTOLIC) 5 MG tablet, Take 5 mg by mouth at bedtime. , Disp: , Rfl:  .  oxyCODONE-acetaminophen (PERCOCET/ROXICET) 5-325 MG per tablet, Take 1  tablet by mouth every 4 (four) hours as needed for pain. (Patient not taking: Reported on 07/12/2017), Disp: 20 tablet, Rfl: 0 .  oxyCODONE-acetaminophen (PERCOCET/ROXICET) 5-325 MG per tablet, Take 1 tablet by mouth every 4 (four) hours as needed. (Patient not taking: Reported on 07/12/2017), Disp: 60 tablet, Rfl: 0 .  pravastatin (PRAVACHOL) 80 MG tablet, Take 80 mg by mouth daily., Disp: , Rfl:  .  quinapril-hydrochlorothiazide (ACCURETIC) 20-12.5 MG per tablet, Take 1 tablet by mouth every morning. , Disp: , Rfl:  .  sitaGLIPtin (JANUVIA) 100 MG tablet, Take 100 mg by mouth every morning. , Disp: , Rfl:   Allergies  Allergen Reactions  . Penicillins Rash       Review of Systems  Musculoskeletal: Positive for arthralgias, joint swelling and myalgias.  All other systems reviewed and are negative.      Objective:   Physical Exam Vitals:   07/12/17 1350  BP: (!) 176/92  Pulse: 83   General AA&O x3. Normal mood and affect.  Vascular Dorsalis pedis and posterior tibial pulses present 1+ and absent bilaterally  Capillary refill normal to all digits. Pedal hair growth normal.  Neurologic Epicritic sensation present bilaterally. Protective sensation with 5.07 monofilament  present bilaterally. Vibratory  sensation present bilaterally.  Dermatologic No open lesions. Interspaces clear of maceration.  Normal skin temperature and turgor. Hyperkeratotic lesions: none bilaterally. Nails: brittle, thickening, elongated, pain to palpation  Orthopedic: No history of amputation. Pes planus deformity bilat. Lesser digital contractures bilat. MMT 5/5 in dorsiflexion, plantarflexion, inversion, and eversion. Normal lower extremity joint ROM without pain or crepitus.     Assessment & Plan:  Patient was evaluated and treated all questions answered  Onychomycosis with Pain -Nails debrided as below.  Procedure: Nail Debridement Rationale: painful onychomycosis Type of Debridement: manual,  sharp debridement. Instrumentation: Nail nipper, rotary burr. Number of Nails: 10  DM without Complication -Will fabricate DM shoes.

## 2017-07-13 DIAGNOSIS — E7800 Pure hypercholesterolemia, unspecified: Secondary | ICD-10-CM | POA: Diagnosis not present

## 2017-07-13 DIAGNOSIS — I10 Essential (primary) hypertension: Secondary | ICD-10-CM | POA: Diagnosis not present

## 2017-07-13 DIAGNOSIS — E1169 Type 2 diabetes mellitus with other specified complication: Secondary | ICD-10-CM | POA: Diagnosis not present

## 2017-07-18 ENCOUNTER — Telehealth: Payer: Self-pay | Admitting: Podiatry

## 2017-07-18 NOTE — Telephone Encounter (Signed)
lvm for pt to call to schedule an appt for diabetic shoe measurements °

## 2017-07-20 NOTE — Telephone Encounter (Signed)
Pts daughter(Joyce Marvis MoellerMiles) called me back and said pt has already been measured and picked out her shoes..  I found the information and called and spoke to a female to give Mrs Marvis MoellerMiles a message that I have gotten the information from where she was measured and picked out shoes and we will call her when shoes are back in to schedule her to pick them up.

## 2017-08-23 DIAGNOSIS — I10 Essential (primary) hypertension: Secondary | ICD-10-CM | POA: Diagnosis not present

## 2017-08-23 DIAGNOSIS — E1169 Type 2 diabetes mellitus with other specified complication: Secondary | ICD-10-CM | POA: Diagnosis not present

## 2017-08-23 DIAGNOSIS — E7800 Pure hypercholesterolemia, unspecified: Secondary | ICD-10-CM | POA: Diagnosis not present

## 2017-09-26 DIAGNOSIS — E1169 Type 2 diabetes mellitus with other specified complication: Secondary | ICD-10-CM | POA: Diagnosis not present

## 2017-09-26 DIAGNOSIS — I10 Essential (primary) hypertension: Secondary | ICD-10-CM | POA: Diagnosis not present

## 2017-09-26 DIAGNOSIS — E7800 Pure hypercholesterolemia, unspecified: Secondary | ICD-10-CM | POA: Diagnosis not present

## 2017-09-27 DIAGNOSIS — E118 Type 2 diabetes mellitus with unspecified complications: Secondary | ICD-10-CM | POA: Diagnosis not present

## 2017-10-12 ENCOUNTER — Ambulatory Visit (INDEPENDENT_AMBULATORY_CARE_PROVIDER_SITE_OTHER): Payer: Medicare Other | Admitting: Orthotics

## 2017-10-12 ENCOUNTER — Encounter: Payer: Self-pay | Admitting: Podiatry

## 2017-10-12 ENCOUNTER — Ambulatory Visit: Payer: Medicare Other | Admitting: Podiatry

## 2017-10-12 DIAGNOSIS — M2142 Flat foot [pes planus] (acquired), left foot: Secondary | ICD-10-CM

## 2017-10-12 DIAGNOSIS — M2042 Other hammer toe(s) (acquired), left foot: Secondary | ICD-10-CM

## 2017-10-12 DIAGNOSIS — B351 Tinea unguium: Secondary | ICD-10-CM | POA: Diagnosis not present

## 2017-10-12 DIAGNOSIS — E1151 Type 2 diabetes mellitus with diabetic peripheral angiopathy without gangrene: Secondary | ICD-10-CM | POA: Diagnosis not present

## 2017-10-12 DIAGNOSIS — M2041 Other hammer toe(s) (acquired), right foot: Secondary | ICD-10-CM

## 2017-10-12 DIAGNOSIS — M2141 Flat foot [pes planus] (acquired), right foot: Secondary | ICD-10-CM

## 2017-10-12 DIAGNOSIS — E1169 Type 2 diabetes mellitus with other specified complication: Secondary | ICD-10-CM | POA: Diagnosis not present

## 2017-10-12 NOTE — Progress Notes (Signed)
  Subjective:  Patient ID: Stacey Nelson, female    DOB: 09/11/1926,  MRN: [id number]  Chief Complaint  Patient presents with  . Nail Problem    Debride/Diabetic foot care   84 y.o. female returns for diabetic foot care. Also here to pick up DM shoes. No new complaints.  Objective:   General AA&O x3. Normal mood and affect.  Vascular Dorsalis pedis pulses present 1+ bilaterally  Posterior tibial pulses absent bilaterally  Capillary refill normal to all digits. Pedal hair growth normal.  Neurologic Epicritic sensation present bilaterally. Protective sensation with 5.07 monofilament  present bilaterally. Vibratory sensation present bilaterally.  Dermatologic No open lesions. Interspaces clear of maceration.  Normal skin temperature and turgor. Hyperkeratotic lesions: None bilaterally. Nails: brittle, onychomycosis, thickening, elongation  Orthopedic: No history of amputation. MMT 5/5 in dorsiflexion, plantarflexion, inversion, and eversion. Normal lower extremity joint ROM without pain or crepitus. Hammertoes bilat. Pes planus bilat.    Assessment & Plan:  Patient was evaluated and treated and all questions answered.  Diabetes with PAD, Onychomycosis -Educated on diabetic footcare. Diabetic risk level 1 -At risk foot care provided as below.  Procedure: Nail Debridement Rationale: Patient meets criteria for routine foot care due to Class B findings. Type of Debridement: manual, sharp debridement. Instrumentation: Nail nipper, rotary burr. Number of Nails: 10  Return in about 3 months (around 01/09/2018) for Diabetic Foot Care.

## 2017-10-12 NOTE — Addendum Note (Signed)
Addended by: Ria ClockPUCKETT, Zeena Starkel on: 10/12/2017 01:27 PM   Modules accepted: Level of Service

## 2017-10-12 NOTE — Progress Notes (Signed)

## 2017-11-08 DIAGNOSIS — E1169 Type 2 diabetes mellitus with other specified complication: Secondary | ICD-10-CM | POA: Diagnosis not present

## 2017-11-08 DIAGNOSIS — E7800 Pure hypercholesterolemia, unspecified: Secondary | ICD-10-CM | POA: Diagnosis not present

## 2017-11-08 DIAGNOSIS — I10 Essential (primary) hypertension: Secondary | ICD-10-CM | POA: Diagnosis not present

## 2017-11-13 DIAGNOSIS — E1169 Type 2 diabetes mellitus with other specified complication: Secondary | ICD-10-CM | POA: Diagnosis not present

## 2017-11-13 DIAGNOSIS — I10 Essential (primary) hypertension: Secondary | ICD-10-CM | POA: Diagnosis not present

## 2017-11-13 DIAGNOSIS — E7800 Pure hypercholesterolemia, unspecified: Secondary | ICD-10-CM | POA: Diagnosis not present

## 2017-12-11 DIAGNOSIS — E118 Type 2 diabetes mellitus with unspecified complications: Secondary | ICD-10-CM | POA: Diagnosis not present

## 2018-01-11 ENCOUNTER — Ambulatory Visit: Payer: Medicare Other | Admitting: Podiatry

## 2018-02-08 DIAGNOSIS — Z961 Presence of intraocular lens: Secondary | ICD-10-CM | POA: Diagnosis not present

## 2018-02-08 DIAGNOSIS — H401133 Primary open-angle glaucoma, bilateral, severe stage: Secondary | ICD-10-CM | POA: Diagnosis not present

## 2018-02-08 DIAGNOSIS — E119 Type 2 diabetes mellitus without complications: Secondary | ICD-10-CM | POA: Diagnosis not present

## 2018-02-08 DIAGNOSIS — H353134 Nonexudative age-related macular degeneration, bilateral, advanced atrophic with subfoveal involvement: Secondary | ICD-10-CM | POA: Diagnosis not present

## 2018-06-28 DIAGNOSIS — I10 Essential (primary) hypertension: Secondary | ICD-10-CM | POA: Diagnosis not present

## 2018-06-28 DIAGNOSIS — E118 Type 2 diabetes mellitus with unspecified complications: Secondary | ICD-10-CM | POA: Diagnosis not present

## 2018-06-28 DIAGNOSIS — E7800 Pure hypercholesterolemia, unspecified: Secondary | ICD-10-CM | POA: Diagnosis not present

## 2018-07-05 DIAGNOSIS — Z880 Allergy status to penicillin: Secondary | ICD-10-CM | POA: Diagnosis not present

## 2018-07-05 DIAGNOSIS — Z0001 Encounter for general adult medical examination with abnormal findings: Secondary | ICD-10-CM | POA: Diagnosis not present

## 2018-07-05 DIAGNOSIS — I10 Essential (primary) hypertension: Secondary | ICD-10-CM | POA: Diagnosis not present

## 2018-07-05 DIAGNOSIS — E7800 Pure hypercholesterolemia, unspecified: Secondary | ICD-10-CM | POA: Diagnosis not present

## 2018-07-16 DIAGNOSIS — K432 Incisional hernia without obstruction or gangrene: Secondary | ICD-10-CM | POA: Diagnosis not present

## 2018-07-17 DIAGNOSIS — E118 Type 2 diabetes mellitus with unspecified complications: Secondary | ICD-10-CM | POA: Diagnosis not present

## 2018-07-31 DIAGNOSIS — Z01419 Encounter for gynecological examination (general) (routine) without abnormal findings: Secondary | ICD-10-CM | POA: Diagnosis not present

## 2018-07-31 DIAGNOSIS — Z1212 Encounter for screening for malignant neoplasm of rectum: Secondary | ICD-10-CM | POA: Diagnosis not present

## 2018-08-14 DIAGNOSIS — Z961 Presence of intraocular lens: Secondary | ICD-10-CM | POA: Diagnosis not present

## 2018-08-14 DIAGNOSIS — H353134 Nonexudative age-related macular degeneration, bilateral, advanced atrophic with subfoveal involvement: Secondary | ICD-10-CM | POA: Diagnosis not present

## 2018-08-14 DIAGNOSIS — E119 Type 2 diabetes mellitus without complications: Secondary | ICD-10-CM | POA: Diagnosis not present

## 2019-04-23 DIAGNOSIS — Z961 Presence of intraocular lens: Secondary | ICD-10-CM | POA: Diagnosis not present

## 2019-04-23 DIAGNOSIS — H401133 Primary open-angle glaucoma, bilateral, severe stage: Secondary | ICD-10-CM | POA: Diagnosis not present

## 2019-04-23 DIAGNOSIS — H353134 Nonexudative age-related macular degeneration, bilateral, advanced atrophic with subfoveal involvement: Secondary | ICD-10-CM | POA: Diagnosis not present

## 2019-04-23 DIAGNOSIS — E119 Type 2 diabetes mellitus without complications: Secondary | ICD-10-CM | POA: Diagnosis not present

## 2019-07-08 DIAGNOSIS — E118 Type 2 diabetes mellitus with unspecified complications: Secondary | ICD-10-CM | POA: Diagnosis not present

## 2019-07-08 DIAGNOSIS — E7800 Pure hypercholesterolemia, unspecified: Secondary | ICD-10-CM | POA: Diagnosis not present

## 2019-07-08 DIAGNOSIS — I10 Essential (primary) hypertension: Secondary | ICD-10-CM | POA: Diagnosis not present

## 2019-07-17 DIAGNOSIS — E7800 Pure hypercholesterolemia, unspecified: Secondary | ICD-10-CM | POA: Diagnosis not present

## 2019-07-17 DIAGNOSIS — I10 Essential (primary) hypertension: Secondary | ICD-10-CM | POA: Diagnosis not present

## 2019-07-17 DIAGNOSIS — Z0000 Encounter for general adult medical examination without abnormal findings: Secondary | ICD-10-CM | POA: Diagnosis not present

## 2019-07-17 DIAGNOSIS — Z0001 Encounter for general adult medical examination with abnormal findings: Secondary | ICD-10-CM | POA: Diagnosis not present

## 2020-02-24 DIAGNOSIS — H6123 Impacted cerumen, bilateral: Secondary | ICD-10-CM | POA: Diagnosis not present

## 2020-06-04 DIAGNOSIS — E119 Type 2 diabetes mellitus without complications: Secondary | ICD-10-CM | POA: Diagnosis not present

## 2020-06-04 DIAGNOSIS — H04123 Dry eye syndrome of bilateral lacrimal glands: Secondary | ICD-10-CM | POA: Diagnosis not present

## 2020-06-04 DIAGNOSIS — H401133 Primary open-angle glaucoma, bilateral, severe stage: Secondary | ICD-10-CM | POA: Diagnosis not present

## 2020-06-04 DIAGNOSIS — Z961 Presence of intraocular lens: Secondary | ICD-10-CM | POA: Diagnosis not present

## 2020-07-16 DIAGNOSIS — I10 Essential (primary) hypertension: Secondary | ICD-10-CM | POA: Diagnosis not present

## 2020-07-16 DIAGNOSIS — E7800 Pure hypercholesterolemia, unspecified: Secondary | ICD-10-CM | POA: Diagnosis not present

## 2020-07-16 DIAGNOSIS — E118 Type 2 diabetes mellitus with unspecified complications: Secondary | ICD-10-CM | POA: Diagnosis not present

## 2020-07-23 DIAGNOSIS — K439 Ventral hernia without obstruction or gangrene: Secondary | ICD-10-CM | POA: Diagnosis not present

## 2020-07-23 DIAGNOSIS — E7800 Pure hypercholesterolemia, unspecified: Secondary | ICD-10-CM | POA: Diagnosis not present

## 2020-07-23 DIAGNOSIS — I10 Essential (primary) hypertension: Secondary | ICD-10-CM | POA: Diagnosis not present

## 2020-07-23 DIAGNOSIS — Z0000 Encounter for general adult medical examination without abnormal findings: Secondary | ICD-10-CM | POA: Diagnosis not present

## 2021-01-25 ENCOUNTER — Ambulatory Visit (HOSPITAL_COMMUNITY)
Admission: EM | Admit: 2021-01-25 | Discharge: 2021-01-25 | Disposition: A | Discharge status: Home / Self Care | Payer: Medicare Other | Attending: Student | Admitting: Student

## 2021-01-25 ENCOUNTER — Encounter (HOSPITAL_COMMUNITY): Payer: Self-pay

## 2021-01-25 ENCOUNTER — Other Ambulatory Visit: Payer: Self-pay

## 2021-01-25 DIAGNOSIS — E1142 Type 2 diabetes mellitus with diabetic polyneuropathy: Secondary | ICD-10-CM

## 2021-01-25 DIAGNOSIS — Z794 Long term (current) use of insulin: Secondary | ICD-10-CM | POA: Diagnosis not present

## 2021-01-25 DIAGNOSIS — L03116 Cellulitis of left lower limb: Secondary | ICD-10-CM | POA: Diagnosis not present

## 2021-01-25 MED ORDER — DOXYCYCLINE HYCLATE 100 MG PO CAPS: 100.0000 mg | ORAL_CAPSULE | Freq: Two times a day (BID) | ORAL | 0 refills | Status: AC

## 2021-01-25 NOTE — Discharge Instructions (Addendum)
-  Doxycycline twice daily for 7 days.  This medication can increase your chance of sunburn, so you should wear sunscreen if spending long periods of time outside. -Seek additional medical attention if you develop new symptoms like fever/chills, increasing of the redness and swelling, dizziness, shortness of breath, chest pain.

## 2021-01-25 NOTE — ED Provider Notes (Signed)
[facility]    CSN: [id number] Arrival date & time: 01/25/21  7209      History   Chief Complaint Chief Complaint  Patient presents with  . Wound Infection    HPI Stacey Nelson is a 87 y.o. female presenting with wound infection.  Medical history diabetes with polyneuropathy, hypertension.  History provided by patient and caregiver.  They state that they think patient had a bug bite on her left knee about 2 weeks ago.  Since then the area has gotten dark and red with some swelling and warmth.  Some scant yellow drainage.  Caregiver has been applying hydrogen peroxide multiple times daily and then a bandage.  Today they present with concern that the wound is getting larger instead of smaller.  Patient is a diabetic.  Sugars running 80-120 fasting at home.  Denies fever/chills, chest pain, dizziness, shortness of breath, wounds elsewhere.  HPI  Past Medical History:  Diagnosis Date  . Diabetes mellitus   . Glaucoma   . Hypertension     Patient Active Problem List   Diagnosis Date Noted  . Quadriceps tendon rupture 06/28/2012    Past Surgical History:  Procedure Laterality Date  . ABDOMINAL HYSTERECTOMY  yrs ago  . COLONOSCOPY    . EYE SURGERY  7 yrs ago   cataract surgery both eyes   . HEMORROIDECTOMY    . HERNIA REPAIR  yrs ago   right side  . QUADRICEPS TENDON REPAIR  06/28/2012   Procedure: REPAIR QUADRICEP TENDON;  Surgeon: Javier Docker, MD;  Location: [facility];  Service: Orthopedics;  Laterality: Right;  Right Repair Quadriceps Tendon  . right eye surgery for glaucoma  2010  . TONSILLECTOMY  yrs ago    OB History   No obstetric history on file.      Home Medications    Prior to Admission medications   Medication Sig Start Date End Date Taking? Authorizing Provider  doxycycline (VIBRAMYCIN) 100 MG capsule Take 1 capsule (100 mg total) by mouth 2 (two) times daily for 7 days. 01/25/21 02/01/21 Yes Rhys Martini, PA-C  acetaminophen (TYLENOL)  500 MG tablet Take 500 mg by mouth every 6 (six) hours as needed. For pain    [provider]  aspirin EC 81 MG tablet Take 81 mg by mouth daily.    [provider]  bimatoprost (LUMIGAN) 0.03 % ophthalmic solution Place 1 drop into the right eye at bedtime.    [provider]  brinzolamide (AZOPT) 1 % ophthalmic suspension Place 1 drop into the right eye 3 (three) times daily.    [provider]  ezetimibe (ZETIA) 10 MG tablet Take 10 mg by mouth at bedtime.     [provider]  insulin glargine (LANTUS) 100 UNIT/ML injection Inject 70 Units into the skin every morning.     [provider]  Multiple Vitamin (MULTIVITAMIN WITH MINERALS) TABS Take 1 tablet by mouth daily.    [provider]  nebivolol (BYSTOLIC) 5 MG tablet Take 5 mg by mouth at bedtime.     [provider]  pravastatin (PRAVACHOL) 80 MG tablet Take 80 mg by mouth daily.    [provider]  quinapril-hydrochlorothiazide (ACCURETIC) 20-12.5 MG per tablet Take 1 tablet by mouth every morning.     [provider]  sitaGLIPtin (JANUVIA) 100 MG tablet Take 100 mg by mouth every morning.     [provider]  enoxaparin (LOVENOX) 40 MG/0.4ML injection Inject  0.4 mLs (40 mg total) into the skin daily. Patient not taking: Reported on 07/12/2017 06/29/12 01/25/21  Jene Every, MD    Family History Family History  Family history unknown: Yes    Social History Social History   Tobacco Use  . Smoking status: Former Smoker    Packs/day: 1.00    Years: 20.00    Pack years: 20.00    Types: Cigarettes    Quit date: 09/12/1968    Years since quitting: 52.4  . Smokeless tobacco: Never Used  Substance Use Topics  . Alcohol use: No  . Drug use: No     Allergies   Bacitracin and Penicillins   Review of Systems Review of Systems  Skin: Positive for wound.  All other systems reviewed and are negative.    Physical Exam Triage  Vital Signs ED Triage Vitals [01/25/21 0924]  Enc Vitals Group     BP (!) 152/72     Pulse Rate 77     Resp 17     Temp 98.4 F (36.9 C)     Temp Source Oral     SpO2 96 %     Weight      Height      Head Circumference      Peak Flow      Pain Score      Pain Loc      Pain Edu?      Excl. in GC?    No data found.  Updated Vital Signs BP (!) 152/72 (BP Location: Left Arm)   Pulse 77   Temp 98.4 F (36.9 C) (Oral)   Resp 17   SpO2 96%   Visual Acuity Right Eye Distance:   Left Eye Distance:   Bilateral Distance:    Right Eye Near:   Left Eye Near:    Bilateral Near:     Physical Exam Vitals reviewed.  Constitutional:      General: She is not in acute distress.    Appearance: Normal appearance. She is not ill-appearing or diaphoretic.  HENT:     Head: Normocephalic and atraumatic.  Cardiovascular:     Rate and Rhythm: Normal rate and regular rhythm.     Heart sounds: Normal heart sounds.  Pulmonary:     Effort: Pulmonary effort is normal.     Breath sounds: Normal breath sounds.  Skin:    General: Skin is warm.     Comments: (See image below) Left knee with 5x5cm area of hyperpigmentation, erythema, warmth.  Mild effusion.  No discharge expressed, but some scant yellow discharge visible on bandage.  Negative Homans sign.  Neurological:     General: No focal deficit present.     Mental Status: She is alert and oriented to person, place, and time.  Psychiatric:        Mood and Affect: Mood normal.        Behavior: Behavior normal.        Thought Content: Thought content normal.        Judgment: Judgment normal.         UC Treatments / Results  Labs (all labs ordered are listed, but only abnormal results are displayed) Labs Reviewed - No data to display  EKG   Radiology No results found.  Procedures Procedures (including critical care time)  Medications Ordered in UC Medications - No data to display  Initial Impression / Assessment and  Plan / UC Course  I have reviewed the triage vital  signs and the nursing notes.  Pertinent labs & imaging results that were available during my care of the patient were reviewed by me and considered in my medical decision making (see chart for details).     This patient is a 87 year old female presenting with cellulitis left lower extremity.  She is afebrile, nontachycardic, nontachypneic.  She is a diabetic, sugars are running 80s to 120s fasting at home.  Continue current regimen.  Doxycycline sent as below, wound care instructions discussed.  ED return precautions discussed.  Final Clinical Impressions(s) / UC Diagnoses   Final diagnoses:  Type 2 diabetes mellitus with diabetic polyneuropathy, with long-term current use of insulin (HCC)  Cellulitis of left lower extremity     Discharge Instructions     -Doxycycline twice daily for 7 days.  This medication can increase your chance of sunburn, so you should wear sunscreen if spending long periods of time outside. -Seek additional medical attention if you develop new symptoms like fever/chills, increasing of the redness and swelling, dizziness, shortness of breath, chest pain.    ED Prescriptions    Medication Sig Dispense Auth. Provider   doxycycline (VIBRAMYCIN) 100 MG capsule Take 1 capsule (100 mg total) by mouth 2 (two) times daily for 7 days. 14 capsule Rhys Martini, PA-C     PDMP not reviewed this encounter.   Rhys Martini, PA-C 01/25/21 [phone]

## 2021-01-25 NOTE — ED Triage Notes (Signed)
Pt presents with wound infection on left knee X 2 weeks; pt caregiver believes she had an insect bite that she scratched and it became infected.  Pt is diabetic.

## 2021-03-03 DIAGNOSIS — L309 Dermatitis, unspecified: Secondary | ICD-10-CM | POA: Diagnosis not present

## 2021-03-03 DIAGNOSIS — H6123 Impacted cerumen, bilateral: Secondary | ICD-10-CM | POA: Diagnosis not present

## 2021-03-04 DIAGNOSIS — L258 Unspecified contact dermatitis due to other agents: Secondary | ICD-10-CM | POA: Diagnosis not present

## 2021-07-20 DIAGNOSIS — E7801 Familial hypercholesterolemia: Secondary | ICD-10-CM | POA: Diagnosis not present

## 2021-07-20 DIAGNOSIS — I10 Essential (primary) hypertension: Secondary | ICD-10-CM | POA: Diagnosis not present

## 2021-07-20 DIAGNOSIS — E118 Type 2 diabetes mellitus with unspecified complications: Secondary | ICD-10-CM | POA: Diagnosis not present

## 2021-07-22 DIAGNOSIS — E7801 Familial hypercholesterolemia: Secondary | ICD-10-CM | POA: Diagnosis not present

## 2021-07-22 DIAGNOSIS — E118 Type 2 diabetes mellitus with unspecified complications: Secondary | ICD-10-CM | POA: Diagnosis not present

## 2021-07-22 DIAGNOSIS — I10 Essential (primary) hypertension: Secondary | ICD-10-CM | POA: Diagnosis not present

## 2021-07-23 DIAGNOSIS — E118 Type 2 diabetes mellitus with unspecified complications: Secondary | ICD-10-CM | POA: Diagnosis not present

## 2021-07-23 DIAGNOSIS — I10 Essential (primary) hypertension: Secondary | ICD-10-CM | POA: Diagnosis not present

## 2021-07-23 DIAGNOSIS — E7801 Familial hypercholesterolemia: Secondary | ICD-10-CM | POA: Diagnosis not present

## 2021-07-27 DIAGNOSIS — E7800 Pure hypercholesterolemia, unspecified: Secondary | ICD-10-CM | POA: Diagnosis not present

## 2021-07-27 DIAGNOSIS — B351 Tinea unguium: Secondary | ICD-10-CM | POA: Diagnosis not present

## 2021-07-27 DIAGNOSIS — E1169 Type 2 diabetes mellitus with other specified complication: Secondary | ICD-10-CM | POA: Diagnosis not present

## 2021-07-27 DIAGNOSIS — Z0000 Encounter for general adult medical examination without abnormal findings: Secondary | ICD-10-CM | POA: Diagnosis not present

## 2021-11-24 DIAGNOSIS — E7800 Pure hypercholesterolemia, unspecified: Secondary | ICD-10-CM | POA: Diagnosis not present

## 2021-11-24 DIAGNOSIS — E1169 Type 2 diabetes mellitus with other specified complication: Secondary | ICD-10-CM | POA: Diagnosis not present

## 2021-11-24 DIAGNOSIS — I10 Essential (primary) hypertension: Secondary | ICD-10-CM | POA: Diagnosis not present

## 2021-11-24 DIAGNOSIS — R350 Frequency of micturition: Secondary | ICD-10-CM | POA: Diagnosis not present

## 2021-11-24 DIAGNOSIS — Z79899 Other long term (current) drug therapy: Secondary | ICD-10-CM | POA: Diagnosis not present

## 2022-01-10 DIAGNOSIS — R918 Other nonspecific abnormal finding of lung field: Secondary | ICD-10-CM | POA: Diagnosis not present

## 2022-01-10 DIAGNOSIS — D649 Anemia, unspecified: Secondary | ICD-10-CM | POA: Diagnosis not present

## 2022-01-10 DIAGNOSIS — R634 Abnormal weight loss: Secondary | ICD-10-CM | POA: Diagnosis not present

## 2022-01-18 DIAGNOSIS — D649 Anemia, unspecified: Secondary | ICD-10-CM | POA: Diagnosis not present

## 2022-08-26 DIAGNOSIS — E1169 Type 2 diabetes mellitus with other specified complication: Secondary | ICD-10-CM | POA: Diagnosis not present

## 2022-08-26 DIAGNOSIS — R600 Localized edema: Secondary | ICD-10-CM | POA: Diagnosis not present

## 2024-05-22 DIAGNOSIS — Z0000 Encounter for general adult medical examination without abnormal findings: Secondary | ICD-10-CM | POA: Diagnosis not present

## 2024-05-22 DIAGNOSIS — E7800 Pure hypercholesterolemia, unspecified: Secondary | ICD-10-CM | POA: Diagnosis not present

## 2024-05-22 DIAGNOSIS — E782 Mixed hyperlipidemia: Secondary | ICD-10-CM | POA: Diagnosis not present

## 2024-05-22 DIAGNOSIS — D509 Iron deficiency anemia, unspecified: Secondary | ICD-10-CM | POA: Diagnosis not present

## 2024-05-22 DIAGNOSIS — K219 Gastro-esophageal reflux disease without esophagitis: Secondary | ICD-10-CM | POA: Diagnosis not present

## 2024-05-22 DIAGNOSIS — E1169 Type 2 diabetes mellitus with other specified complication: Secondary | ICD-10-CM | POA: Diagnosis not present

## 2024-05-22 DIAGNOSIS — I10 Essential (primary) hypertension: Secondary | ICD-10-CM | POA: Diagnosis not present

## 2024-05-23 DIAGNOSIS — I10 Essential (primary) hypertension: Secondary | ICD-10-CM | POA: Diagnosis not present

## 2024-05-23 DIAGNOSIS — E1169 Type 2 diabetes mellitus with other specified complication: Secondary | ICD-10-CM | POA: Diagnosis not present

## 2024-05-29 DIAGNOSIS — I6523 Occlusion and stenosis of bilateral carotid arteries: Secondary | ICD-10-CM | POA: Diagnosis not present

## 2024-05-29 DIAGNOSIS — R0989 Other specified symptoms and signs involving the circulatory and respiratory systems: Secondary | ICD-10-CM | POA: Diagnosis not present

## 2024-08-09 ENCOUNTER — Emergency Department (HOSPITAL_BASED_OUTPATIENT_CLINIC_OR_DEPARTMENT_OTHER): Admitting: Radiology

## 2024-08-09 ENCOUNTER — Other Ambulatory Visit: Payer: Self-pay

## 2024-08-09 ENCOUNTER — Emergency Department (HOSPITAL_BASED_OUTPATIENT_CLINIC_OR_DEPARTMENT_OTHER)
Admission: EM | Admit: 2024-08-09 | Discharge: 2024-08-09 | Disposition: A | Discharge status: Home / Self Care | Attending: Emergency Medicine | Admitting: Emergency Medicine

## 2024-08-09 ENCOUNTER — Encounter (HOSPITAL_BASED_OUTPATIENT_CLINIC_OR_DEPARTMENT_OTHER): Payer: Self-pay | Admitting: Emergency Medicine

## 2024-08-09 DIAGNOSIS — Z794 Long term (current) use of insulin: Secondary | ICD-10-CM | POA: Insufficient documentation

## 2024-08-09 DIAGNOSIS — R059 Cough, unspecified: Secondary | ICD-10-CM | POA: Diagnosis present

## 2024-08-09 DIAGNOSIS — J40 Bronchitis, not specified as acute or chronic: Secondary | ICD-10-CM | POA: Insufficient documentation

## 2024-08-09 DIAGNOSIS — I700 Atherosclerosis of aorta: Secondary | ICD-10-CM | POA: Diagnosis not present

## 2024-08-09 DIAGNOSIS — I10 Essential (primary) hypertension: Secondary | ICD-10-CM | POA: Diagnosis not present

## 2024-08-09 DIAGNOSIS — R079 Chest pain, unspecified: Secondary | ICD-10-CM | POA: Diagnosis not present

## 2024-08-09 DIAGNOSIS — Z79899 Other long term (current) drug therapy: Secondary | ICD-10-CM | POA: Diagnosis not present

## 2024-08-09 DIAGNOSIS — R0602 Shortness of breath: Secondary | ICD-10-CM | POA: Diagnosis not present

## 2024-08-09 DIAGNOSIS — R7989 Other specified abnormal findings of blood chemistry: Secondary | ICD-10-CM | POA: Insufficient documentation

## 2024-08-09 DIAGNOSIS — F0390 Unspecified dementia without behavioral disturbance: Secondary | ICD-10-CM | POA: Insufficient documentation

## 2024-08-09 DIAGNOSIS — Z7982 Long term (current) use of aspirin: Secondary | ICD-10-CM | POA: Insufficient documentation

## 2024-08-09 DIAGNOSIS — R062 Wheezing: Secondary | ICD-10-CM | POA: Diagnosis present

## 2024-08-09 LAB — CBC WITH DIFFERENTIAL/PLATELET
Abs Immature Granulocytes: 0.17 K/uL — ABNORMAL HIGH (ref 0.00–0.07)
Basophils Absolute: 0.1 K/uL (ref 0.0–0.1)
Basophils Relative: 1 %
Eosinophils Absolute: 0.2 K/uL (ref 0.0–0.5)
Eosinophils Relative: 2 %
HCT: 29.9 % — ABNORMAL LOW (ref 36.0–46.0)
Hemoglobin: 10 g/dL — ABNORMAL LOW (ref 12.0–15.0)
Immature Granulocytes: 2 %
Lymphocytes Relative: 20 %
Lymphs Abs: 2 K/uL (ref 0.7–4.0)
MCH: 30.9 pg (ref 26.0–34.0)
MCHC: 33.4 g/dL (ref 30.0–36.0)
MCV: 92.3 fL (ref 80.0–100.0)
Monocytes Absolute: 1.1 K/uL — ABNORMAL HIGH (ref 0.1–1.0)
Monocytes Relative: 12 %
Neutro Abs: 6.2 K/uL (ref 1.7–7.7)
Neutrophils Relative %: 63 %
Platelets: 356 K/uL (ref 150–400)
RBC: 3.24 MIL/uL — ABNORMAL LOW (ref 3.87–5.11)
RDW: 12.2 % (ref 11.5–15.5)
WBC: 9.7 K/uL (ref 4.0–10.5)
nRBC: 0 % (ref 0.0–0.2)

## 2024-08-09 LAB — BASIC METABOLIC PANEL WITH GFR
Anion gap: 12 (ref 5–15)
BUN: 19 mg/dL (ref 8–23)
CO2: 27 mmol/L (ref 22–32)
Calcium: 10.3 mg/dL (ref 8.9–10.3)
Chloride: 98 mmol/L (ref 98–111)
Creatinine, Ser: 0.98 mg/dL (ref 0.44–1.00)
GFR, Estimated: 53 mL/min — ABNORMAL LOW (ref >60)
Glucose, Bld: 196 mg/dL — ABNORMAL HIGH (ref 70–99)
Potassium: 4.3 mmol/L (ref 3.5–5.1)
Sodium: 137 mmol/L (ref 135–145)

## 2024-08-09 LAB — RESP PANEL BY RT-PCR (RSV, FLU A&B, COVID)  RVPGX2
Influenza A by PCR: NEGATIVE
Influenza B by PCR: NEGATIVE
Resp Syncytial Virus by PCR: NEGATIVE
SARS Coronavirus 2 by RT PCR: NEGATIVE

## 2024-08-09 LAB — PRO BRAIN NATRIURETIC PEPTIDE: Pro Brain Natriuretic Peptide: 673 pg/mL — ABNORMAL HIGH (ref <300.0)

## 2024-08-09 MED ORDER — METHYLPREDNISOLONE SODIUM SUCC 125 MG IJ SOLR
125.0000 mg | Freq: Once | INTRAMUSCULAR | Status: AC
  Administered 2024-08-09: 125 mg via INTRAVENOUS
  Filled 2024-08-09: qty 2

## 2024-08-09 MED ORDER — AZITHROMYCIN 250 MG PO TABS: 250.0000 mg | ORAL_TABLET | Freq: Every day | ORAL | 0 refills | Status: AC

## 2024-08-09 MED ORDER — PREDNISONE 10 MG PO TABS
50.0000 mg | ORAL_TABLET | Freq: Every day | ORAL | 0 refills | Status: AC
Start: 2024-08-10 — End: ?

## 2024-08-09 MED ORDER — ALBUTEROL SULFATE HFA 108 (90 BASE) MCG/ACT IN AERS
2.0000 | INHALATION_SPRAY | Freq: Once | RESPIRATORY_TRACT | Status: AC
  Administered 2024-08-09: 2 via RESPIRATORY_TRACT
  Filled 2024-08-09: qty 6.7

## 2024-08-09 MED ORDER — IPRATROPIUM-ALBUTEROL 0.5-2.5 (3) MG/3ML IN SOLN
3.0000 mL | Freq: Once | RESPIRATORY_TRACT | Status: AC
  Administered 2024-08-09: 3 mL via RESPIRATORY_TRACT
  Filled 2024-08-09: qty 3

## 2024-08-09 NOTE — ED Provider Notes (Signed)
 [place] EM[facility] DEPARTMENT AT [facility] Provider Note   CSN: [id number] Arrival date & time: 08/09/24  9070     Patient presents with: Fatigue, Cough, and Wheezing   Stacey Nelson is a 89 y.o. female.   HPI     89 year old female comes in with chief complaint of shortness of breath, weakness. Patient is accompanied by her daughter, provides substantial part of the history.  According to the daughter, patient has been having generalized weakness over the last 3 or 4 days.  Her activity level is not the same.  She is requiring more assistance with ambulation.  Patient also complains of feeling tired and noted to be short of breath.  Patient has had some cough, congestion with cough producing large amount of white, thick phlegm.  Patient noted to be wheezing on exam.  There is no known history of CHF, COPD. Patient however does not have any upper respiratory congestion, fevers, chills, sore throat and her appetite has been normal.  Review of system negative for UTI-like symptoms, abdominal pain.  Prior to Admission medications   Medication Sig Start Date End Date Taking? Authorizing Provider  azithromycin  (ZITHROMAX ) 250 MG tablet Take 1 tablet (250 mg total) by mouth daily. Take first 2 tablets together, then 1 every day until finished. 08/09/24  Yes Charlyn Sora, MD  predniSONE  (DELTASONE ) 10 MG tablet Take 5 tablets (50 mg total) by mouth daily. 08/10/24  Yes Charlyn Sora, MD  acetaminophen  (TYLENOL ) 500 MG tablet Take 500 mg by mouth every 6 (six) hours as needed. For pain    [provider]  aspirin EC 81 MG tablet Take 81 mg by mouth daily.    [provider]  bimatoprost  (LUMIGAN ) 0.03 % ophthalmic solution Place 1 drop into the right eye at bedtime.    [provider]  brinzolamide  (AZOPT ) 1 % ophthalmic suspension Place 1 drop into the right eye 3 (three) times daily.    [provider]  ezetimibe  (ZETIA ) 10 MG tablet  Take 10 mg by mouth at bedtime.     [provider]  insulin  glargine (LANTUS ) 100 UNIT/ML injection Inject 70 Units into the skin every morning.     [provider]  Multiple Vitamin (MULTIVITAMIN WITH MINERALS) TABS Take 1 tablet by mouth daily.    [provider]  nebivolol  (BYSTOLIC ) 5 MG tablet Take 5 mg by mouth at bedtime.     [provider]  pravastatin (PRAVACHOL) 80 MG tablet Take 80 mg by mouth daily.    [provider]  quinapril -hydrochlorothiazide  (ACCURETIC ) 20-12.5 MG per tablet Take 1 tablet by mouth every morning.     [provider]  sitaGLIPtin (JANUVIA) 100 MG tablet Take 100 mg by mouth every morning.     [provider]  enoxaparin  (LOVENOX ) 40 MG/0.4ML injection Inject 0.4 mLs (40 mg total) into the skin daily. Patient not taking: Reported on 07/12/2017 06/29/12 01/25/21  Duwayne Purchase, MD    Allergies: Bacitracin and Penicillins    Review of Systems  Updated Vital Signs BP (!) 133/48   Pulse 61   Temp 98.4 F (36.9 C) (Axillary)   Resp 19   Ht 5' 3 (1.6 m)   Wt 63.5 kg   SpO2 100%   BMI 24.80 kg/m   Physical Exam Vitals and nursing note reviewed.  Constitutional:      Appearance: She is well-developed.  HENT:     Head: Atraumatic.  Cardiovascular:  Rate and Rhythm: Normal rate.  Pulmonary:     Effort: Pulmonary effort is normal.     Breath sounds: Wheezing present.     Comments: Diffuse wheezing Musculoskeletal:     Cervical back: Neck supple.     Right lower leg: No edema.     Left lower leg: No edema.  Skin:    General: Skin is warm and dry.  Neurological:     Mental Status: She is alert. Mental status is at baseline.     (all labs ordered are listed, but only abnormal results are displayed) Labs Reviewed  BASIC METABOLIC PANEL WITH GFR - Abnormal; Notable for the following components:      Result Value   Glucose, Bld 196 (*)    GFR, Estimated 53 (*)    All other  components within normal limits  PRO BRAIN NATRIURETIC PEPTIDE - Abnormal; Notable for the following components:   Pro Brain Natriuretic Peptide 673.0 (*)    All other components within normal limits  CBC WITH DIFFERENTIAL/PLATELET - Abnormal; Notable for the following components:   RBC 3.24 (*)    Hemoglobin 10.0 (*)    HCT 29.9 (*)    Monocytes Absolute 1.1 (*)    Abs Immature Granulocytes 0.17 (*)    All other components within normal limits  RESP PANEL BY RT-PCR (RSV, FLU A&B, COVID)  [id number]    EKG: EKG Interpretation Date/Time:  Friday August 09 2024 10:33:11 EST Ventricular Rate:  67 PR Interval:  207 QRS Duration:  88 QT Interval:  388 QTC Calculation: 410 R Axis:   -18  Text Interpretation: Sinus rhythm Borderline left axis deviation Borderline T wave abnormalities No acute changes No significant change since last tracing Confirmed by Charlyn Sora ([id number]) on 08/09/2024 11:29:34 AM  Radiology: [facility] Chest Port 1 View Result Date: 08/09/2024 EXAM: 1 VIEW(S) XRAY OF THE CHEST 08/09/2024 11:52:56 AM COMPARISON: None available. CLINICAL HISTORY: Chest pain FINDINGS: LUNGS AND PLEURA: No focal pulmonary opacity. No pleural effusion. No pneumothorax. HEART AND MEDIASTINUM: Aortic atherosclerosis. No acute abnormality of the cardiac and mediastinal silhouettes. BONES AND SOFT TISSUES: Osteopenia. Bilateral shoulder degenerative changes. IMPRESSION: 1. No acute cardiopulmonary abnormality. Electronically signed by: Norleen Boxer MD 08/09/2024 12:18 PM EST RP Workstation: [id number]     Procedures   Medications Ordered in the ED  methylPREDNISolone sodium succinate (SOLU-MEDROL) 125 mg/2 mL injection 125 mg (has no administration in time range)  albuterol (VENTOLIN HFA) 108 (90 Base) MCG/ACT inhaler 2 puff (has no administration in time range)  ipratropium-albuterol (DUONEB) 0.5-2.5 (3) MG/3ML nebulizer solution 3 mL (3 mLs Nebulization Given 08/09/24 1027)    Clinical Course  as of 08/09/24 1413  Fri Aug 09, 2024  1411 Pro Brain natriuretic peptide(!) BNP is over 600.  Patient's repeat exam is improved.  Wheezing is faint at this time.  Rest of the workup is normal including COVID, flu test.  I discussed this finding with the patient's.  I informed him that the BNP is elevated, but x-ray of the chest does not show any pulmonary edema.  I also discussed that her lung exam is improved after the breathing treatment.  I told him that the BNP elevation is nonspecific, but I would like for them to follow-up with cardiologist.  Patient's daughter prefers following up with PCP about the elevated BNP, which I think is also reasonable.  Wheezing is still uncertain etiology.  I will give put her on prednisone, provide albuterol inhaler here that she can  use as needed and also give azithromycin.  Return precautions have been discussed with the patient and family.  She will return to the ER if she starts having worsening shortness of breath, chest pain. [AN]    Clinical Course User Index [AN] Charlyn Sora, MD                                 Medical Decision Making Amount and/or Complexity of Data Reviewed Labs: ordered. Decision-making details documented in ED Course. Radiology: ordered.  Risk Prescription drug management.   This patient presents to the ED with chief complaint(s) of weakness, congestion, cough and wheezing with pertinent past medical history of hypertension, glaucoma and no diagnosis of COPD or CHF.The complaint involves an extensive differential diagnosis and also carries with it a high risk of complications and morbidity.    The differential diagnosis includes : Acute viral illness, pneumonia, pulmonary edema, pleural effusion, valvular pathology such as aortic stenosis leading to new CHF, silent MI.  The initial plan is to get basic labs, chest x-ray and give patient a DuoNeb treatment.   Additional history obtained: Additional history  obtained from family Records reviewed previous admission documents.  Last admission was in 2013.  No cardiac workup noted.  Independent labs interpretation:  The following labs were independently interpreted: Elevated BNP.  Normal CBC.  COVID, flu test negative.  Independent visualization and interpretation of imaging: - I independently visualized the following imaging with scope of interpretation limited to determining acute life threatening conditions related to emergency care: X-ray of the chest, which revealed no clear evidence of pleural effusion or pulmonary edema.  No focal consolidation.  Treatment and Reassessment: See the workup tab.  Consideration for admission or further workup: Admission was considered for the elevated BNP and new wheezing in someone over the age of 31.  However patient has O2 sats of 100%, no tachycardia, tachypnea and has no significant functional deterioration at this time.  I think outpatient follow-up should be sufficient.  Family is comfortable with this plan and prefers it as well.  Return precautions discussed.   Final diagnoses:  Bronchitis  Elevated brain natriuretic peptide (BNP) level    ED Discharge Orders          Ordered    predniSONE (DELTASONE) 10 MG tablet  Daily        08/09/24 1409    azithromycin (ZITHROMAX) 250 MG tablet  Daily        08/09/24 1409               Charlyn Sora, MD 08/09/24 1414

## 2024-08-09 NOTE — ED Triage Notes (Signed)
 Pt via pov from home with daughter, who reports that she has been feeling unwell x 3-4 days. She has had cough, fatigue. Pt began wheezing this morning, and has had nosebleed this morning as well (also had one last week). Pt alert, has some dementia at baseline. NAD noted.

## 2024-08-09 NOTE — Discharge Instructions (Addendum)
 You were seen in the emergency room for wheezing, shortness of breath.  However workup here reveals no clear evidence of pneumonia.  We suspect that you might have upper respiratory  bronchitis.  Additionally, we noted that he had lab called BNP, which we ordered to look and see if there is any evidence of heart failure, is slightly elevated.  We recommend that you follow-up with your primary care doctor with this finding so that they can proceed with additional workup or consultation.  For now, use the inhaler every 4-6 hours for wheezing.  Take the medications that are prescribed.  Return to the emergency room if you start having worsening chest pain, shortness of breath, near fainting or fainting.
